# Patient Record
Sex: Male | Born: 1990 | Race: White | Hispanic: No | Marital: Single | Smoking: Never smoker
Health system: Southern US, Community
[De-identification: ages and names within clinical notes are randomized; demographics above are authoritative.]

## PROBLEM LIST (undated history)

## (undated) DIAGNOSIS — G4730 Sleep apnea, unspecified: Secondary | ICD-10-CM

## (undated) HISTORY — DX: Sleep apnea, unspecified: G47.30

---
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
## 2020-06-13 ENCOUNTER — Encounter (HOSPITAL_COMMUNITY): Payer: Self-pay | Admitting: Emergency Medicine

## 2020-06-13 ENCOUNTER — Ambulatory Visit (HOSPITAL_COMMUNITY)
Admission: EM | Admit: 2020-06-13 | Discharge: 2020-06-13 | Disposition: A | Discharge status: Home / Self Care | Payer: BC Managed Care – PPO

## 2020-06-13 ENCOUNTER — Other Ambulatory Visit: Payer: Self-pay

## 2020-06-13 DIAGNOSIS — R1031 Right lower quadrant pain: Secondary | ICD-10-CM

## 2020-06-13 NOTE — ED Triage Notes (Signed)
Pt here for RLQ pain onset 2 days that is intermittently  Denies f/v/n/d, constipation  Denies pain at the moment.   A&O x4... NAD.Marland Kitchen. ambulatory

## 2020-06-13 NOTE — ED Provider Notes (Signed)
[facility]    CSN: [id number] Arrival date & time: 06/13/20  1853      History   Chief Complaint Chief Complaint  Patient presents with  . Abdominal Pain    HPI Everest Brod is a 30 y.o. male.   Here today with 2 days of intermittent episodes of mild to moderate RLQ abdominal pain that he states feels like a balloon inside of him. Seems to be present when he wakes up in the morning but works it's way out once he's up and moving for the day. Comes back randomly and does not seem to have triggers or relieving factors. Denies N/V/D, constipation, fever, skin changes, new foods, sick contacts.      History reviewed. No pertinent past medical history.  There are no problems to display for this patient.   History reviewed. No pertinent surgical history.     Home Medications    Prior to Admission medications   Not on File    Family History History reviewed. No pertinent family history.  Social History Social History   Tobacco Use  . Smoking status: Never Smoker  . Smokeless tobacco: Never Used  Substance Use Topics  . Alcohol use: Yes     Allergies   Penicillins   Review of Systems Review of Systems PER HPI    Physical Exam Triage Vital Signs ED Triage Vitals  Enc Vitals Group     BP 06/13/20 1934 (!) 165/95     Pulse Rate 06/13/20 1934 98     Resp 06/13/20 1934 20     Temp 06/13/20 1934 98.7 F (37.1 C)     Temp Source 06/13/20 1934 Oral     SpO2 06/13/20 1934 100 %     Weight --      Height --      Head Circumference --      Peak Flow --      Pain Score 06/13/20 1933 0     Pain Loc --      Pain Edu? --      Excl. in GC? --    No data found.  Updated Vital Signs BP (!) 165/95 (BP Location: Right Arm)   Pulse 98   Temp 98.7 F (37.1 C) (Oral)   Resp 20   SpO2 100%   Visual Acuity Right Eye Distance:   Left Eye Distance:   Bilateral Distance:    Right Eye Near:   Left Eye Near:    Bilateral Near:     Physical  Exam Vitals and nursing note reviewed.  Constitutional:      Appearance: Normal appearance.  HENT:     Head: Atraumatic.     Nose: Nose normal.     Mouth/Throat:     Mouth: Mucous membranes are moist.     Pharynx: Oropharynx is clear. No posterior oropharyngeal erythema.  Eyes:     Extraocular Movements: Extraocular movements intact.     Conjunctiva/sclera: Conjunctivae normal.  Cardiovascular:     Rate and Rhythm: Normal rate and regular rhythm.  Pulmonary:     Effort: Pulmonary effort is normal.     Breath sounds: Normal breath sounds.  Abdominal:     General: Bowel sounds are normal. There is no distension.     Palpations: Abdomen is soft. There is no mass.     Tenderness: There is no abdominal tenderness. There is no right CVA tenderness, left CVA tenderness, guarding or rebound.     Hernia: No hernia  is present.  Musculoskeletal:        General: Normal range of motion.     Cervical back: Normal range of motion and neck supple.  Skin:    General: Skin is warm and dry.  Neurological:     General: No focal deficit present.     Mental Status: He is oriented to person, place, and time.  Psychiatric:        Mood and Affect: Mood normal.        Thought Content: Thought content normal.        Judgment: Judgment normal.      UC Treatments / Results  Labs (all labs ordered are listed, but only abnormal results are displayed) Labs Reviewed - No data to display  EKG   Radiology No results found.  Procedures Procedures (including critical care time)  Medications Ordered in UC Medications - No data to display  Initial Impression / Assessment and Plan / UC Course  I have reviewed the triage vital signs and the nursing notes.  Pertinent labs & imaging results that were available during my care of the patient were reviewed by me and considered in my medical decision making (see chart for details).     Pain not currently present, vitals and exam completely benign.  DIscussed OTC pain relievers, close monitoring for return/worsening sxs and outpatient f/u in the next week for recheck and scans if needed. ER precautions reviewed additionally.  Final Clinical Impressions(s) / UC Diagnoses   Final diagnoses:  Right lower quadrant abdominal pain   Discharge Instructions   None    ED Prescriptions    None     PDMP not reviewed this encounter.   Particia Nearing, [place] 06/17/20 1127

## 2020-08-22 ENCOUNTER — Other Ambulatory Visit: Payer: Self-pay | Admitting: Physician Assistant

## 2020-08-22 ENCOUNTER — Encounter: Payer: Self-pay | Admitting: Physician Assistant

## 2020-08-22 ENCOUNTER — Other Ambulatory Visit: Payer: Self-pay

## 2020-08-22 ENCOUNTER — Ambulatory Visit: Payer: BC Managed Care – PPO | Admitting: Physician Assistant

## 2020-08-22 VITALS — BP 150/100 | HR 85 | Temp 97.3°F | Ht 70.75 in | Wt 362.0 lb

## 2020-08-22 DIAGNOSIS — E785 Hyperlipidemia, unspecified: Secondary | ICD-10-CM | POA: Insufficient documentation

## 2020-08-22 DIAGNOSIS — E8881 Metabolic syndrome: Secondary | ICD-10-CM | POA: Insufficient documentation

## 2020-08-22 DIAGNOSIS — I10 Essential (primary) hypertension: Secondary | ICD-10-CM

## 2020-08-22 DIAGNOSIS — R7309 Other abnormal glucose: Secondary | ICD-10-CM

## 2020-08-22 DIAGNOSIS — E669 Obesity, unspecified: Secondary | ICD-10-CM | POA: Insufficient documentation

## 2020-08-22 HISTORY — DX: Hyperlipidemia, unspecified: E78.5

## 2020-08-22 HISTORY — DX: Essential (primary) hypertension: I10

## 2020-08-22 LAB — COMPREHENSIVE METABOLIC PANEL
ALT: 42 U/L (ref 0–53)
AST: 25 U/L (ref 0–37)
Albumin: 4.6 g/dL (ref 3.5–5.2)
Alkaline Phosphatase: 69 U/L (ref 39–117)
BUN: 10 mg/dL (ref 6–23)
CO2: 30 mEq/L (ref 19–32)
Calcium: 9.7 mg/dL (ref 8.4–10.5)
Chloride: 101 mEq/L (ref 96–112)
Creatinine, Ser: 0.77 mg/dL (ref 0.40–1.50)
GFR: 122.76 mL/min (ref >60.00)
Glucose, Bld: 126 mg/dL — ABNORMAL HIGH (ref 70–99)
Potassium: 4.5 mEq/L (ref 3.5–5.1)
Sodium: 138 mEq/L (ref 135–145)
Total Bilirubin: 0.7 mg/dL (ref 0.2–1.2)
Total Protein: 7.4 g/dL (ref 6.0–8.3)

## 2020-08-22 LAB — LIPID PANEL
Cholesterol: 216 mg/dL — ABNORMAL HIGH (ref 0–200)
HDL: 32.7 mg/dL — ABNORMAL LOW (ref >39.00)
LDL Cholesterol: 145 mg/dL — ABNORMAL HIGH (ref 0–99)
NonHDL: 183.73
Total CHOL/HDL Ratio: 7
Triglycerides: 193 mg/dL — ABNORMAL HIGH (ref 0.0–149.0)
VLDL: 38.6 mg/dL (ref 0.0–40.0)

## 2020-08-22 LAB — CBC WITH DIFFERENTIAL/PLATELET
Basophils Absolute: 0 10*3/uL (ref 0.0–0.1)
Basophils Relative: 0.3 % (ref 0.0–3.0)
Eosinophils Absolute: 0.2 10*3/uL (ref 0.0–0.7)
Eosinophils Relative: 2.5 % (ref 0.0–5.0)
HCT: 47.3 % (ref 39.0–52.0)
Hemoglobin: 16.2 g/dL (ref 13.0–17.0)
Lymphocytes Relative: 22.3 % (ref 12.0–46.0)
Lymphs Abs: 2.1 10*3/uL (ref 0.7–4.0)
MCHC: 34.2 g/dL (ref 30.0–36.0)
MCV: 89.8 fl (ref 78.0–100.0)
Monocytes Absolute: 0.7 10*3/uL (ref 0.1–1.0)
Monocytes Relative: 7 % (ref 3.0–12.0)
Neutro Abs: 6.5 10*3/uL (ref 1.4–7.7)
Neutrophils Relative %: 67.9 % (ref 43.0–77.0)
Platelets: 310 10*3/uL (ref 150.0–400.0)
RBC: 5.27 Mil/uL (ref 4.22–5.81)
RDW: 13.3 % (ref 11.5–15.5)
WBC: 9.5 10*3/uL (ref 4.0–10.5)

## 2020-08-22 LAB — HEMOGLOBIN A1C: Hgb A1c MFr Bld: 6.2 % (ref 4.6–6.5)

## 2020-08-22 MED ORDER — AMLODIPINE BESYLATE 5 MG PO TABS: 5.0000 mg | ORAL_TABLET | Freq: Every day | ORAL | 1 refills | Status: DC

## 2020-08-22 NOTE — Patient Instructions (Addendum)
It was great to see you!  Start Amlodopine (Norvasc) 5 mg daily.  Keep your blood pressure log for [place] -- take about 2-3 times per week.  Initial goal is less than 140/90 I'd be happiest if we get to 130/80  Let's follow-up in 4-6 weeks, sooner if you have concerns.  Take care,  Jarold Motto PA-C   How to Take Your Blood Pressure Blood pressure is a measurement of how strongly your blood is pressing against the walls of your arteries. Arteries are blood vessels that carry blood from your heart throughout your body. Your health care provider takes your blood pressure at each office visit. You can also take your own blood pressure at home with a blood pressure monitor. You may need to take your own blood pressure to:  Confirm a diagnosis of high blood pressure (hypertension).  Monitor your blood pressure over time.  Make sure your blood pressure medicine is working. Supplies needed:  Blood pressure monitor.  Dining room chair to sit in.  Table or desk.  Small notebook and pencil or pen. How to prepare To get the most accurate reading, avoid the following for 30 minutes before you check your blood pressure:  Drinking caffeine.  Drinking alcohol.  Eating.  Smoking.  Exercising. Five minutes before you check your blood pressure:  Use the bathroom and urinate so that you have an empty bladder.  Sit quietly in a dining room chair. Do not sit in a soft couch or an armchair. Do not talk. How to take your blood pressure To check your blood pressure, follow the instructions in the manual that came with your blood pressure monitor. If you have a digital blood pressure monitor, the instructions may be as follows: 1. Sit up straight in a chair. 2. Place your feet on the floor. Do not cross your ankles or legs. 3. Rest your left arm at the level of your heart on a table or desk or on the arm of a chair. 4. Pull up your shirt sleeve. 5. Wrap the blood pressure cuff around  the upper part of your left arm, 1 inch (2.5 cm) above your elbow. It is best to wrap the cuff around bare skin. 6. Fit the cuff snugly around your arm. You should be able to place only one finger between the cuff and your arm. 7. Position the cord so that it rests in the bend of your elbow. 8. Press the power button. 9. Sit quietly while the cuff inflates and deflates. 10. Read the digital reading on the monitor screen and write the numbers down (record them) in a notebook. 11. Wait 2-3 minutes, then repeat the steps, starting at step 1.   What does my blood pressure reading mean? A blood pressure reading consists of a higher number over a lower number. Ideally, your blood pressure should be below 120/80. The first ("top") number is called the systolic pressure. It is a measure of the pressure in your arteries as your heart beats. The second ("bottom") number is called the diastolic pressure. It is a measure of the pressure in your arteries as the heart relaxes. Blood pressure is classified into five stages. The following are the stages for adults who do not have a short-term serious illness or a chronic condition. Systolic pressure and diastolic pressure are measured in a unit called mm Hg (millimeters of mercury).  Normal  Systolic pressure: below 120.  Diastolic pressure: below 80. Elevated  Systolic pressure: 120-129.  Diastolic  pressure: below 80. Hypertension stage 1  Systolic pressure: 130-139.  Diastolic pressure: 80-89. Hypertension stage 2  Systolic pressure: 140 or above.  Diastolic pressure: 90 or above. You can have elevated blood pressure or hypertension even if only the systolic or only the diastolic number in your reading is higher than normal. Follow these instructions at home:  Check your blood pressure as often as recommended by your health care provider.  Check your blood pressure at the same time every day.  Take your monitor to the next appointment with  your health care provider to make sure that: ? You are using it correctly. ? It provides accurate readings.  Be sure you understand what your goal blood pressure numbers are.  Tell your health care provider if you are having any side effects from blood pressure medicine.  Keep all follow-up visits as told by your health care provider. This is important. General tips  Your health care provider can suggest a reliable monitor that will meet your needs. There are several types of home blood pressure monitors.  Choose a monitor that has an arm cuff. Do not choose a monitor that measures your blood pressure from your wrist or finger.  Choose a cuff that wraps snugly around your upper arm. You should be able to fit only one finger between your arm and the cuff.  You can buy a blood pressure monitor at most drugstores or online. Where to find more information American Heart Association: www.heart.org Contact a health care provider if:  Your blood pressure is consistently high. Get help right away if:  Your systolic blood pressure is higher than 180.  Your diastolic blood pressure is higher than 120. Summary  Blood pressure is a measurement of how strongly your blood is pressing against the walls of your arteries.  A blood pressure reading consists of a higher number over a lower number. Ideally, your blood pressure should be below 120/80.  Check your blood pressure at the same time every day.  Avoid caffeine, alcohol, smoking, and exercise for 30 minutes prior to checking your blood pressure. These agents can affect the accuracy of the blood pressure reading. This information is not intended to replace advice given to you by your health care provider. Make sure you discuss any questions you have with your health care provider. Document Revised: 05/14/2019 Document Reviewed: 05/14/2019 Elsevier Patient Education  2021 [employer].

## 2020-08-22 NOTE — Progress Notes (Signed)
Barry Walker is a 30 y.o. male is here to establish care and discuss blood pressure.  I acted as a Neurosurgeon for [employer], PA-C Barry Mull, LPN   History of Present Illness:   Chief Complaint  Patient presents with  . Establish Care  . c/o elevated blood pressure     HPI  Pt is here to establish care today.  Elevated blood pressure Pt c/o blood pressure elevated the past 6-8 months. Pt says when he has been at urgent care it has been elevated. Pt denies headaches, dizziness, blurred vision, chest pain, SOB or lower leg edema. Denies excessive caffeine intake, stimulant usage, excessive alcohol intake or increase in salt consumption.  Does not have blood pressure monitor at home. Does not drink soda. Drinks lots of water and occasional sugar-free lemonade.  Recently joined [employer] -- goes 4-5 times per week.  Played baseball from [age] years old. Was around 210 lbs at this time and then had a major stent of depression that caused him to stop taking care of himself causing weight gain and poor food choices. Does snore at night. Does not feel like he is waking up every few hours, does not gasp or choke.     Health Maintenance Due  Topic Date Due  . Hepatitis C Screening  Never done  . HIV Screening  Never done  . TETANUS/TDAP  Never done    Past Medical History:  Diagnosis Date  . Allergy February 10, 1993   Penicilin     Social History   Tobacco Use  . Smoking status: Never Smoker  . Smokeless tobacco: Never Used  . Tobacco comment: A cigar on special occasions, but no cigs/vape  Vaping Use  . Vaping Use: Never used  Substance Use Topics  . Alcohol use: Yes    Alcohol/week: 3.0 standard drinks    Types: 3 Standard drinks or equivalent per week    Comment: Only drink on Saturday during a normal week  . Drug use: Never    History reviewed. No pertinent surgical history.  Family History  Problem Relation Age of Onset  . ADD / ADHD Mother   . Anxiety  disorder Mother   . Depression Mother   . ADD / ADHD Sister   . Colon cancer Maternal Grandfather 46  . Hypertension Father   . Prostate cancer Neg Hx   . Breast cancer Neg Hx   . Stroke Neg Hx   . Heart attack Neg Hx     PMHx, SurgHx, SocialHx, FamHx, Medications, and Allergies were reviewed in the Visit Navigator and updated as appropriate.   There are no problems to display for this patient.   Social History   Tobacco Use  . Smoking status: Never Smoker  . Smokeless tobacco: Never Used  . Tobacco comment: A cigar on special occasions, but no cigs/vape  Vaping Use  . Vaping Use: Never used  Substance Use Topics  . Alcohol use: Yes    Alcohol/week: 3.0 standard drinks    Types: 3 Standard drinks or equivalent per week    Comment: Only drink on Saturday during a normal week  . Drug use: Never    Current Medications and Allergies:    Current Outpatient Medications:  .  amLODipine (NORVASC) 5 MG tablet, Take 1 tablet (5 mg total) by mouth daily., Disp: 30 tablet, Rfl: 1 .  Multiple Vitamin (MULTIVITAMIN) tablet, Take 1 tablet by mouth daily., Disp: , Rfl:  .  naproxen sodium (ALEVE) 220  MG tablet, Take 220 mg by mouth as needed., Disp: , Rfl:    Allergies  Allergen Reactions  . Penicillins     Reaction = unknown, was told as a child he had a reaction to it.     Review of Systems   ROS  Negative unless otherwise specified per HPI.  Vitals:   Vitals:   08/22/20 1118  BP: (!) 150/100  Pulse: 85  Temp: (!) 97.3 F (36.3 C)  TempSrc: Temporal  SpO2: 96%  Weight: (!) 362 lb (164.2 kg)  Height: 5' 10.75" (1.797 m)     Body mass index is 50.85 kg/m.   Physical Exam:    Physical Exam Vitals and nursing note reviewed.  Constitutional:      General: He is not in acute distress.    Appearance: He is well-developed. He is not ill-appearing or toxic-appearing.  Cardiovascular:     Rate and Rhythm: Normal rate and regular rhythm.     Pulses: Normal pulses.      Heart sounds: Normal heart sounds, S1 normal and S2 normal.     Comments: No LE edema Pulmonary:     Effort: Pulmonary effort is normal.     Breath sounds: Normal breath sounds.  Skin:    General: Skin is warm and dry.  Neurological:     Mental Status: He is alert.     GCS: GCS eye subscore is 4. GCS verbal subscore is 5. GCS motor subscore is 6.  Psychiatric:        Speech: Speech normal.        Behavior: Behavior normal. Behavior is cooperative.      Assessment and Plan:    Barry Walker was seen today for establish care and c/o elevated blood pressure .  Diagnoses and all orders for this visit:  Primary hypertension Above goal. New diagnosis for patient. Start norvasc 5 mg daily. Update blood work. Continue to work on weight loss and healthy lifestyle habits. BP log and parameters provided. Follow-up in 4-6 weeks, sooner if concerns. -     CBC with Differential/Platelet -     Comprehensive metabolic panel -     Lipid panel  Other orders -     amLODipine (NORVASC) 5 MG tablet; Take 1 tablet (5 mg total) by mouth daily.   CMA or LPN served as scribe during this visit. History, Physical, and Plan performed by medical provider. The above documentation has been reviewed and is accurate and complete.   Barry Motto, PA-C [place] 08/22/2020  Follow-up: No follow-ups on file.

## 2020-08-22 NOTE — Addendum Note (Signed)
Addended by: Vincenza Hews on: 08/22/2020 02:38 PM   Modules accepted: Orders

## 2020-09-26 ENCOUNTER — Ambulatory Visit: Payer: BC Managed Care – PPO | Admitting: Physician Assistant

## 2020-09-26 ENCOUNTER — Other Ambulatory Visit: Payer: Self-pay

## 2020-09-26 ENCOUNTER — Encounter: Payer: Self-pay | Admitting: Physician Assistant

## 2020-09-26 VITALS — BP 154/100 | HR 82 | Temp 97.8°F | Ht 70.75 in | Wt 356.5 lb

## 2020-09-26 DIAGNOSIS — I10 Essential (primary) hypertension: Secondary | ICD-10-CM | POA: Diagnosis not present

## 2020-09-26 MED ORDER — AMLODIPINE BESYLATE 10 MG PO TABS: 10.0000 mg | ORAL_TABLET | Freq: Every day | ORAL | 1 refills | Status: DC

## 2020-09-26 NOTE — Patient Instructions (Addendum)
It was great to see you!  Increase Norvasc to 10 mg  Premier protein bar (or something with similar nutrition breakdown)  Switch up your cereals, refer to handout  Please follow-up with Dr. Jimmey Ralph or Alyssa in 2-3 months to recheck your , sooner if you have concerns.  Take care,  Jarold Motto PA-C

## 2020-09-26 NOTE — Progress Notes (Signed)
Barry Walker is a 30 y.o. male is here for follow up.  I acted as a Neurosurgeon for [employer], PA-C Corky Mull, LPN   History of Present Illness:   Chief Complaint  Patient presents with  . Hypertension    HPI  Hypertension Pt here for f/u on blood pressure. Currently taking Amlodipine 5 mg daily, was started on this during our last visit. He has been checking blood pressure at home twice a week, systolic 149-167, diastolic 93-101.  Pt denies headaches, dizziness, blurred vision, chest pain, SOB or lower leg edema. Denies excessive caffeine intake, stimulant usage, excessive alcohol intake or increase in salt consumption.  He has lost another 6 lb since I last saw him. He is working out at [employer] regularly and eating healthy. Continues to feel motivated, buying new clothes.  Dietary recall: Pre-work out -- nature [place] bar Post-work out -- fat free greek yogurt with added scoop of oats, cheerios (x2 servings) with skim milk  Lunch: sugar free strawberry preserves, peanut butter, keto bread Dinner: buffalo grilled chicken, fat free shredded cheese, fresh veggies -- all in a spinach wrap Snack -- nature valley bar  No sodas/juices  Health Maintenance Due  Topic Date Due  . Hepatitis C Screening  Never done  . HIV Screening  Never done  . TETANUS/TDAP  Never done    Past Medical History:  Diagnosis Date  . Allergy 03-Oct-1992   Penicilin  . HLD (hyperlipidemia) 08/22/2020  . Hypertension 08/22/2020     Social History   Tobacco Use  . Smoking status: Never Smoker  . Smokeless tobacco: Never Used  . Tobacco comment: A cigar on special occasions, but no cigs/vape  Vaping Use  . Vaping Use: Never used  Substance Use Topics  . Alcohol use: Yes    Alcohol/week: 3.0 standard drinks    Types: 3 Standard drinks or equivalent per week    Comment: Only drink on Saturday during a normal week  . Drug use: Never    History reviewed. No pertinent surgical  history.  Family History  Problem Relation Age of Onset  . ADD / ADHD Mother   . Anxiety disorder Mother   . Depression Mother   . ADD / ADHD Sister   . Colon cancer Maternal Grandfather 30  . Hypertension Father   . Prostate cancer Neg Hx   . Breast cancer Neg Hx   . Stroke Neg Hx   . Heart attack Neg Hx     PMHx, SurgHx, SocialHx, FamHx, Medications, and Allergies were reviewed in the Visit Navigator and updated as appropriate.   Patient Active Problem List   Diagnosis Date Noted  . Obesity 08/22/2020  . Hypertension 08/22/2020  . Insulin resistance 08/22/2020  . HLD (hyperlipidemia) 08/22/2020    Social History   Tobacco Use  . Smoking status: Never Smoker  . Smokeless tobacco: Never Used  . Tobacco comment: A cigar on special occasions, but no cigs/vape  Vaping Use  . Vaping Use: Never used  Substance Use Topics  . Alcohol use: Yes    Alcohol/week: 3.0 standard drinks    Types: 3 Standard drinks or equivalent per week    Comment: Only drink on Saturday during a normal week  . Drug use: Never    Current Medications and Allergies:    Current Outpatient Medications:  .  amLODipine (NORVASC) 10 MG tablet, Take 1 tablet (10 mg total) by mouth daily., Disp: 90 tablet, Rfl: 1 .  Multiple  Vitamin (MULTIVITAMIN) tablet, Take 1 tablet by mouth daily., Disp: , Rfl:  .  naproxen sodium (ALEVE) 220 MG tablet, Take 220 mg by mouth as needed., Disp: , Rfl:    Allergies  Allergen Reactions  . Penicillins     Reaction = unknown, was told as a child he had a reaction to it.     Review of Systems   ROS  Negative unless otherwise specified per HPI.  Vitals:   Vitals:   09/26/20 1103  BP: (!) 154/100  Pulse: 82  Temp: 97.8 F (36.6 C)  TempSrc: Temporal  SpO2: 97%  Weight: (!) 356 lb 8 oz (161.7 kg)  Height: 5' 10.75" (1.797 m)     Body mass index is 50.07 kg/m.   Physical Exam:    Physical Exam Vitals and nursing note reviewed.  Constitutional:       General: He is not in acute distress.    Appearance: He is well-developed. He is not ill-appearing or toxic-appearing.  Cardiovascular:     Rate and Rhythm: Normal rate and regular rhythm.     Pulses: Normal pulses.     Heart sounds: Normal heart sounds, S1 normal and S2 normal.     Comments: No LE edema Pulmonary:     Effort: Pulmonary effort is normal.     Breath sounds: Normal breath sounds.  Skin:    General: Skin is warm and dry.  Neurological:     Mental Status: He is alert.     GCS: GCS eye subscore is 4. GCS verbal subscore is 5. GCS motor subscore is 6.  Psychiatric:        Speech: Speech normal.        Behavior: Behavior normal. Behavior is cooperative.      Assessment and Plan:    Barry Walker was seen today for hypertension.  Diagnoses and all orders for this visit:  Primary hypertension  Other orders -     amLODipine (NORVASC) 10 MG tablet; Take 1 tablet (10 mg total) by mouth daily.   Remains uncontrolled. Increase amlodopine to 10 mg daily. Follow-up in office in 2-3 months with Parker/Alyssa, sooner if concerns. Reviewed his dietary regimen today. Recommended: 1. replacing oats with nuts for additional protein at breakfast 2. choosing better nutritionally balanced bar, such as Premier Protein, or other high protein bar 3. Provided handout on healthy cereal options, work on only 1 serving  CMA or LPN served as Neurosurgeon during this visit. History, Physical, and Plan performed by medical provider. The above documentation has been reviewed and is accurate and complete.  Jarold Motto, PA-C [place] 09/26/2020  Follow-up: No follow-ups on file.

## 2020-10-16 ENCOUNTER — Other Ambulatory Visit: Payer: Self-pay | Admitting: Physician Assistant

## 2020-12-15 ENCOUNTER — Encounter: Payer: Self-pay | Admitting: Family Medicine

## 2020-12-15 ENCOUNTER — Ambulatory Visit: Payer: BC Managed Care – PPO | Admitting: Family Medicine

## 2020-12-15 ENCOUNTER — Other Ambulatory Visit: Payer: Self-pay

## 2020-12-15 VITALS — BP 137/85 | HR 98 | Temp 98.0°F | Ht 70.75 in | Wt 351.8 lb

## 2020-12-15 DIAGNOSIS — K219 Gastro-esophageal reflux disease without esophagitis: Secondary | ICD-10-CM | POA: Insufficient documentation

## 2020-12-15 DIAGNOSIS — M6208 Separation of muscle (nontraumatic), other site: Secondary | ICD-10-CM

## 2020-12-15 DIAGNOSIS — E669 Obesity, unspecified: Secondary | ICD-10-CM | POA: Diagnosis not present

## 2020-12-15 DIAGNOSIS — I10 Essential (primary) hypertension: Secondary | ICD-10-CM

## 2020-12-15 HISTORY — DX: Separation of muscle (nontraumatic), other site: M62.08

## 2020-12-15 HISTORY — DX: Gastro-esophageal reflux disease without esophagitis: K21.9

## 2020-12-15 NOTE — Progress Notes (Signed)
Patient  Shoji Pertuit is a 30 y.o. male who presents today for an office visit.  Assessment/Plan:  Chronic Problems Addressed Today: Rectus diastasis Discussed diagnosis with patient.  Discussed conservative management including weight loss and core strengthening exercises.  GERD (gastroesophageal reflux disease) Stable on over-the-counter Tums as needed.  Hypertension Recently had amlodipine changed to 10 mg daily.  We will continue current dose.  He will follow-up with his PCP in about 6 months.  Continue home monitoring.  Obesity Discussed lifestyle modifications.  He is working on diet and exercise and is going to the gym regularly.     Subjective:  HPI:  With concern for abdominal hernia.  Noticed few days ago.  Noticed a bulge above his bellybutton.  Has also had some reflux.  He comes with his wife.  No constipation.  No diarrhea.  No abdominal pain.  No recent injuries or precipitating events.       Objective:  Physical Exam: BP 137/85   Pulse 98   Temp 98 F (36.7 C) (Temporal)   Ht 5' 10.75" (1.797 m)   Wt (!) 351 lb 12.8 oz (159.6 kg)   SpO2 98%   BMI 49.41 kg/m   Gen: No acute distress, resting comfortably GI: Rectus diastases noted.  No umbilical hernia. Neuro: Grossly normal, moves all extremities Psych: Normal affect and thought content      Amandeep Nesmith M. Jimmey Ralph, MD 12/15/2020 3:05 PM

## 2020-12-15 NOTE — Assessment & Plan Note (Signed)
Recently had amlodipine changed to 10 mg daily.  We will continue current dose.  He will follow-up with his PCP in about 6 months.  Continue home monitoring.

## 2020-12-15 NOTE — Assessment & Plan Note (Signed)
Discussed lifestyle modifications.  He is working on diet and exercise and is going to the gym regularly.

## 2020-12-15 NOTE — Assessment & Plan Note (Signed)
Stable on over-the-counter Tums as needed.

## 2020-12-15 NOTE — Assessment & Plan Note (Signed)
Discussed diagnosis with patient.  Discussed conservative management including weight loss and core strengthening exercises.

## 2020-12-15 NOTE — Patient Instructions (Addendum)
It was very nice to see you today!  You have rectus diastases.  Please continue to work on exercise and weight loss.  Your blood pressure looks good today.  Please continue your current blood pressure medication.  We will see you back in 6 months.  Please come back to see [place] sooner if needed.  Take care, Dr Jimmey Ralph  PLEASE NOTE:  If you had any lab tests please let us know if you have not heard back within a few days. You may see your results on mychart before we have a chance to review them but we will give you a call once they are reviewed by [place]. If we ordered any referrals today, please let us know if you have not heard from their office within the next week.   Please try these tips to maintain a healthy lifestyle:  Eat at least 3 REAL meals and 1-2 snacks per day.  Aim for no more than 5 hours between eating.  If you eat breakfast, please do so within one hour of getting up.   Each meal should contain half fruits/vegetables, one quarter protein, and one quarter carbs (no bigger than a computer mouse)  Cut down on sweet beverages. This includes juice, soda, and sweet tea.   Drink at least 1 glass of water with each meal and aim for at least 8 glasses per day  Exercise at least 150 minutes every week.    Diastasis Recti  Diastasis recti is a condition in which the muscles of the abdomen (rectus abdominis muscles) become thin and separate. The result is a wider space between the muscles of the right and left abdomen (abdominal muscles). This wider space between the muscles may cause a bulge in the middle of the abdomen. This bulge may be noticed when a person is straining or when he or shesits up after lying down. Diastasis recti can affect men and women. It is most common among pregnant women, babies, people with obesity, and people who have had abdominal surgery.Exercise or surgery may help correct this condition. What are the causes? Common causes of this condition  include: Pregnancy. As the uterus grows in size, it puts pressure on the abdominal muscles, causing the muscles to separate. Obesity. Excess fat puts pressure on abdominal muscles. Weight lifting. Some exercises of the abdomen. Advanced age. Genetics. Having had surgery on the abdomen before. What increases the risk? This condition is more likely to develop in: Women. Newborns, especially newborns who are born early (prematurely). What are the signs or symptoms? Common symptoms of this condition include: A bulge in the middle of your abdomen. You will notice it most when you sit up or strain. Pain in your low back, hips, or the area between your hip bones (pelvis). Constipation. Being unable to control when you urinate (urinary incontinence). Bloating. Poor posture. How is this diagnosed? This condition is diagnosed with a physical exam. During the exam, your health care provider will ask you to lie flat on your back and do a crunch or half sit-up. If you have diastasis recti, a bulge will appear lengthwise between your abdominal muscles in the center of your abdomen. Your health care provider will measure the gap between your muscles with one of the following: A medical device used to measure the space between two objects (caliper). A tape measure. CT scan. Ultrasound. Finger spaces. Your health care provider will measure the space using his or her fingers. How is this treated? If your muscle  separation is not too large, you may not need treatment. However, if you are a woman who plans to become pregnant again, you should treat this condition before your next pregnancy. Treatment may include: Physical therapy exercises to strengthen and tighten your abdominal muscles. Lifestyle changes such as weight loss and exercise. Over-the-counter pain medicines as needed. Surgery to correct the separation. Follow these instructions at home: Activity Return to your normal activities as told  by your health care provider. Ask your health care provider what activities are safe for you. Do exercises as told by your health care provider. Make sure you are doing your exercises and movements correctly when lifting weights or doing exercises using your abdominal muscles or the muscles in the center of your body that give stability (core muscles). Proper form can help to prevent this condition from happening again. General instructions If you are overweight, ask your health care provider for help with weight loss. Losing even a small amount of weight can help to improve your diastasis recti. Take over-the-counter or prescription medicines only as told by your health care provider. Do not strain. Straining can make the separation worse. Examples of straining include: Pushing hard to have a bowel movement, such as when you have constipation. Lifting heavy objects or lifting children. Standing up and sitting down. You may need to take these actions to prevent or treat constipation: Drink enough fluid to keep your urine pale yellow. Take over-the-counter or prescription medicines. Eat foods that are high in fiber, such as beans, whole grains, and fresh fruits and vegetables. Limit foods that are high in fat and processed sugars, such as fried or sweet foods. Keep all follow-up visits. This is important. Contact a health care provider if: You notice a new bulge in your abdomen. Get help right away if: You experience severe discomfort in your abdomen. You develop severe abdominal pain along with nausea, vomiting, or a fever. Summary Diastasis recti is a condition in which the muscles of the abdomen (rectus abdominismuscles) become thin and separate. You may notice a bulge in your abdomen because the space has widened between the muscles of the right and left abdomen. The most common symptom is a bulge in the middle of your abdomen. You will notice it most when you sit up or strain. This  condition is diagnosed with a physical exam. If the muscle separation is not too big, you may not need treatment. Otherwise, you may need to do physical therapy or have surgery. This information is not intended to replace advice given to you by your health care provider. Make sure you discuss any questions you have with your healthcare provider. Document Revised: 01/21/2020 Document Reviewed: 01/21/2020 Elsevier Patient Education  2022 [employer].

## 2020-12-26 ENCOUNTER — Ambulatory Visit: Payer: BC Managed Care – PPO | Admitting: Family Medicine

## 2021-02-11 ENCOUNTER — Encounter (HOSPITAL_BASED_OUTPATIENT_CLINIC_OR_DEPARTMENT_OTHER): Payer: Self-pay | Admitting: *Deleted

## 2021-02-11 ENCOUNTER — Emergency Department (HOSPITAL_BASED_OUTPATIENT_CLINIC_OR_DEPARTMENT_OTHER)
Admission: EM | Admit: 2021-02-11 | Discharge: 2021-02-11 | Disposition: A | Discharge status: Home / Self Care | Payer: BC Managed Care – PPO | Attending: Emergency Medicine | Admitting: Emergency Medicine

## 2021-02-11 ENCOUNTER — Other Ambulatory Visit: Payer: Self-pay

## 2021-02-11 DIAGNOSIS — Z79899 Other long term (current) drug therapy: Secondary | ICD-10-CM | POA: Insufficient documentation

## 2021-02-11 DIAGNOSIS — R350 Frequency of micturition: Secondary | ICD-10-CM | POA: Insufficient documentation

## 2021-02-11 DIAGNOSIS — I10 Essential (primary) hypertension: Secondary | ICD-10-CM | POA: Diagnosis not present

## 2021-02-11 DIAGNOSIS — R109 Unspecified abdominal pain: Secondary | ICD-10-CM | POA: Insufficient documentation

## 2021-02-11 DIAGNOSIS — M545 Low back pain, unspecified: Secondary | ICD-10-CM | POA: Diagnosis not present

## 2021-02-11 DIAGNOSIS — R110 Nausea: Secondary | ICD-10-CM | POA: Diagnosis not present

## 2021-02-11 LAB — URINALYSIS, ROUTINE W REFLEX MICROSCOPIC
Bilirubin Urine: NEGATIVE
Glucose, UA: NEGATIVE mg/dL
Hgb urine dipstick: NEGATIVE
Leukocytes,Ua: NEGATIVE
Nitrite: NEGATIVE
Protein, ur: NEGATIVE mg/dL
Specific Gravity, Urine: 1.017 (ref 1.005–1.030)
pH: 6.5 (ref 5.0–8.0)

## 2021-02-11 LAB — CBC WITH DIFFERENTIAL/PLATELET
Abs Immature Granulocytes: 0.02 10*3/uL (ref 0.00–0.07)
Basophils Absolute: 0 10*3/uL (ref 0.0–0.1)
Basophils Relative: 0 %
Eosinophils Absolute: 0.1 10*3/uL (ref 0.0–0.5)
Eosinophils Relative: 1 %
HCT: 49.9 % (ref 39.0–52.0)
Hemoglobin: 17 g/dL (ref 13.0–17.0)
Immature Granulocytes: 0 %
Lymphocytes Relative: 15 %
Lymphs Abs: 1.6 10*3/uL (ref 0.7–4.0)
MCH: 29.8 pg (ref 26.0–34.0)
MCHC: 34.1 g/dL (ref 30.0–36.0)
MCV: 87.5 fL (ref 80.0–100.0)
Monocytes Absolute: 0.5 10*3/uL (ref 0.1–1.0)
Monocytes Relative: 5 %
Neutro Abs: 8.3 10*3/uL — ABNORMAL HIGH (ref 1.7–7.7)
Neutrophils Relative %: 79 %
Platelets: 330 10*3/uL (ref 150–400)
RBC: 5.7 MIL/uL (ref 4.22–5.81)
RDW: 12.8 % (ref 11.5–15.5)
WBC: 10.5 10*3/uL (ref 4.0–10.5)
nRBC: 0 % (ref 0.0–0.2)

## 2021-02-11 LAB — BASIC METABOLIC PANEL
Anion gap: 12 (ref 5–15)
BUN: 11 mg/dL (ref 6–20)
CO2: 24 mmol/L (ref 22–32)
Calcium: 10.4 mg/dL — ABNORMAL HIGH (ref 8.9–10.3)
Chloride: 101 mmol/L (ref 98–111)
Creatinine, Ser: 0.92 mg/dL (ref 0.61–1.24)
GFR, Estimated: >60 mL/min (ref >60)
Glucose, Bld: 128 mg/dL — ABNORMAL HIGH (ref 70–99)
Potassium: 3.8 mmol/L (ref 3.5–5.1)
Sodium: 137 mmol/L (ref 135–145)

## 2021-02-11 NOTE — Discharge Instructions (Addendum)
Your workup was very reassuring in the ED today. Your pain may be related to muscle soreness. I would recommend Ibuprofen as needed for pain.   Follow up with your PCP regarding ED visit and for further evaluation  Return to the ED IMMEDIATELY for any new/worsening symptoms

## 2021-02-11 NOTE — ED Provider Notes (Signed)
[facility] EMERGENCY DEPT Provider Note   CSN: [id number] Arrival date & time: 02/11/21  1317     History Chief Complaint  Patient presents with   Flank Pain    Barry Walker is a 30 y.o. male who presents to the ED today with complaint of gradual onset, intermittent, achy, left flank pain that began about 4 days ago. Pt also complains of urinary frequency and slight nausea this AM. He has not taken anything for pain and just dealt with it. He does not have any pain currently. He states the pain is brought on by certain movements. He decided to come get it checked out today after feeling nauseated.  She has never experienced the symptoms in the past.  He has no history of kidney stones.  Denies any fevers, chills, vomiting, abdominal pain, testicular pain. Denies any recent heavy lifting.   The history is provided by the patient and medical records.      Past Medical History:  Diagnosis Date   Allergy 11-06[birth year]92   Penicilin   GERD (gastroesophageal reflux disease) 12/15/2020   HLD (hyperlipidemia) 08/22/2020   Hypertension 08/22/2020   Rectus diastasis 12/15/2020    Patient Active Problem List   Diagnosis Date Noted   GERD (gastroesophageal reflux disease) 12/15/2020   Rectus diastasis 12/15/2020   Obesity 08/22/2020   Hypertension 08/22/2020   Insulin resistance 08/22/2020   HLD (hyperlipidemia) 08/22/2020    History reviewed. No pertinent surgical history.     Family History  Problem Relation Age of Onset   ADD / ADHD Mother    Anxiety disorder Mother    Depression Mother    ADD / ADHD Sister    Colon cancer Maternal Grandfather 62   Hypertension Father    Prostate cancer Neg Hx    Breast cancer Neg Hx    Stroke Neg Hx    Heart attack Neg Hx     Social History   Tobacco Use   Smoking status: Never   Smokeless tobacco: Never   Tobacco comments:    A cigar on special occasions, but no cigs/vape  Vaping Use   Vaping Use: Never used   Substance Use Topics   Alcohol use: Yes    Alcohol/week: 3.0 standard drinks    Types: 3 Standard drinks or equivalent per week    Comment: Only drink on Saturday during a normal week   Drug use: Never    Home Medications Prior to Admission medications   Medication Sig Start Date End Date Taking? Authorizing Provider  amLODipine (NORVASC) 10 MG tablet Take 1 tablet (10 mg total) by mouth daily. 09/26/20  Yes Jarold Motto, PA  Multiple Vitamin (MULTIVITAMIN) tablet Take 1 tablet by mouth daily.   Yes [provider]  naproxen sodium (ALEVE) 220 MG tablet Take 220 mg by mouth as needed.    [provider]    Allergies    Penicillins  Review of Systems   Review of Systems  Constitutional:  Negative for chills and fever.  Gastrointestinal:  Positive for nausea. Negative for abdominal pain, diarrhea and vomiting.  Genitourinary:  Positive for flank pain and frequency. Negative for dysuria and hematuria.  All other systems reviewed and are negative.  Physical Exam Updated Vital Signs BP 133/85   Pulse 96   Temp 98.5 F (36.9 C) (Oral)   Resp 20   Ht 5\' 10"  (1.778 m)   Wt (!) 154.2 kg   SpO2 98%   BMI 48.78 kg/m  Physical Exam Vitals and nursing note reviewed.  Constitutional:      Appearance: He is obese. He is not ill-appearing or diaphoretic.  HENT:     Head: Normocephalic and atraumatic.  Eyes:     Conjunctiva/sclera: Conjunctivae normal.  Cardiovascular:     Rate and Rhythm: Normal rate and regular rhythm.  Pulmonary:     Effort: Pulmonary effort is normal.     Breath sounds: Normal breath sounds. No wheezing, rhonchi or rales.  Abdominal:     Palpations: Abdomen is soft.     Tenderness: There is no abdominal tenderness. There is no right CVA tenderness, left CVA tenderness, guarding or rebound.     Comments: No obvious CVA TTP at this time however pt points to left CVA when describing pain  Musculoskeletal:     Cervical back: Neck  supple.  Skin:    General: Skin is warm and dry.  Neurological:     Mental Status: He is alert.    ED Results / Procedures / Treatments   Labs (all labs ordered are listed, but only abnormal results are displayed) Labs Reviewed  URINALYSIS, ROUTINE W REFLEX MICROSCOPIC - Abnormal; Notable for the following components:      Result Value   Ketones, ur TRACE (*)    All other components within normal limits  BASIC METABOLIC PANEL - Abnormal; Notable for the following components:   Glucose, Bld 128 (*)    Calcium 10.4 (*)    All other components within normal limits  CBC WITH DIFFERENTIAL/PLATELET - Abnormal; Notable for the following components:   Neutro Abs 8.3 (*)    All other components within normal limits    EKG None  Radiology No results found.  Procedures Procedures   Medications Ordered in ED Medications - No data to display  ED Course  I have reviewed the triage vital signs and the nursing notes.  Pertinent labs & imaging results that were available during my care of the patient were reviewed by me and considered in my medical decision making (see chart for details).    MDM Rules/Calculators/A&P                           30 year old male who presents to the ED today with complaint of left flank pain, nausea, urinary frequency for the past 4 days without history of same.  On arrival to the ED patient is afebrile, mildly tachycardic at 107 however this is decreased while he is in the room.  I suspect he was likely tachycardic when he first walked into the ED today due to strenuous activity.  Remainder vitals are unremarkable.  On my exam he has no obvious CVA TTP however points to his left flank when describing the pain. He has no abdominal tenderness palpation.  No rebound or guarding.  We will plan for urinalysis at this time to assess for any microscopic hemoglobin to suggest kidney stone.  We will also plan for lab work.  May consider CT scan if urinalysis does  show hemoglobin however question MSK pain at this time?  CBC without leukocytosis. Hgb stable at 17.0 BMP with glucose 128. No other electrolyte abnormalities. Calcium slightly elevated at 10.4 however only 0.1 above the upper limit of normal.  U/A with trace ketones; no other findings including hgb or infection.   Workup reassuring at this time. Suspect MSK type pain given pain brought on by certain movements of his back and  pt very comfortable appaering without obvious distress typically shown with stones. Have recommended Ibuprofen as needed for pain and PCP follow up. Pt in agreement with plan and stable for discharge.   This note was prepared using Dragon voice recognition software and may include unintentional dictation errors due to the inherent limitations of voice recognition software.   Final Clinical Impression(s) / ED Diagnoses Final diagnoses:  Acute left-sided low back pain without sciatica    Rx / DC Orders ED Discharge Orders     None        Discharge Instructions      Your workup was very reassuring in the ED today. Your pain may be related to muscle soreness. I would recommend Ibuprofen as needed for pain.   Follow up with your PCP regarding ED visit and for further evaluation  Return to the ED IMMEDIATELY for any new/worsening symptoms       Tanda Rockers, PA-C 02/11/21 1442    Gloris Manchester, MD 02/12/21 [phone]

## 2021-02-11 NOTE — ED Triage Notes (Signed)
Left flank/back pain with frequent urination and occ nausea.

## 2021-03-24 ENCOUNTER — Other Ambulatory Visit: Payer: Self-pay | Admitting: Physician Assistant

## 2021-06-18 ENCOUNTER — Ambulatory Visit: Payer: BC Managed Care – PPO | Admitting: Physician Assistant

## 2021-06-18 ENCOUNTER — Other Ambulatory Visit: Payer: Self-pay

## 2021-06-18 ENCOUNTER — Encounter: Payer: Self-pay | Admitting: Physician Assistant

## 2021-06-18 VITALS — BP 170/100 | HR 109 | Temp 98.0°F | Ht 70.0 in | Wt 351.0 lb

## 2021-06-18 DIAGNOSIS — E669 Obesity, unspecified: Secondary | ICD-10-CM

## 2021-06-18 DIAGNOSIS — I10 Essential (primary) hypertension: Secondary | ICD-10-CM | POA: Diagnosis not present

## 2021-06-18 DIAGNOSIS — E8881 Metabolic syndrome: Secondary | ICD-10-CM

## 2021-06-18 DIAGNOSIS — R29818 Other symptoms and signs involving the nervous system: Secondary | ICD-10-CM

## 2021-06-18 DIAGNOSIS — E785 Hyperlipidemia, unspecified: Secondary | ICD-10-CM

## 2021-06-18 DIAGNOSIS — Z1159 Encounter for screening for other viral diseases: Secondary | ICD-10-CM

## 2021-06-18 DIAGNOSIS — Z114 Encounter for screening for human immunodeficiency virus [HIV]: Secondary | ICD-10-CM

## 2021-06-18 LAB — CBC WITH DIFFERENTIAL/PLATELET
Basophils Absolute: 0 10*3/uL (ref 0.0–0.1)
Basophils Relative: 0.4 % (ref 0.0–3.0)
Eosinophils Absolute: 0.1 10*3/uL (ref 0.0–0.7)
Eosinophils Relative: 1.5 % (ref 0.0–5.0)
HCT: 48.2 % (ref 39.0–52.0)
Hemoglobin: 16.3 g/dL (ref 13.0–17.0)
Lymphocytes Relative: 18.9 % (ref 12.0–46.0)
Lymphs Abs: 1.7 10*3/uL (ref 0.7–4.0)
MCHC: 33.7 g/dL (ref 30.0–36.0)
MCV: 88.5 fl (ref 78.0–100.0)
Monocytes Absolute: 0.7 10*3/uL (ref 0.1–1.0)
Monocytes Relative: 7.8 % (ref 3.0–12.0)
Neutro Abs: 6.4 10*3/uL (ref 1.4–7.7)
Neutrophils Relative %: 71.4 % (ref 43.0–77.0)
Platelets: 312 10*3/uL (ref 150.0–400.0)
RBC: 5.44 Mil/uL (ref 4.22–5.81)
RDW: 13.5 % (ref 11.5–15.5)
WBC: 9 10*3/uL (ref 4.0–10.5)

## 2021-06-18 LAB — COMPREHENSIVE METABOLIC PANEL
ALT: 23 U/L (ref 0–53)
AST: 20 U/L (ref 0–37)
Albumin: 4.5 g/dL (ref 3.5–5.2)
Alkaline Phosphatase: 86 U/L (ref 39–117)
BUN: 11 mg/dL (ref 6–23)
CO2: 25 mEq/L (ref 19–32)
Calcium: 9.7 mg/dL (ref 8.4–10.5)
Chloride: 101 mEq/L (ref 96–112)
Creatinine, Ser: 0.72 mg/dL (ref 0.40–1.50)
GFR: 124.55 mL/min (ref >60.00)
Glucose, Bld: 144 mg/dL — ABNORMAL HIGH (ref 70–99)
Potassium: 3.4 mEq/L — ABNORMAL LOW (ref 3.5–5.1)
Sodium: 138 mEq/L (ref 135–145)
Total Bilirubin: 0.9 mg/dL (ref 0.2–1.2)
Total Protein: 8.2 g/dL (ref 6.0–8.3)

## 2021-06-18 LAB — HEPATITIS C ANTIBODY
Hepatitis C Ab: NONREACTIVE
SIGNAL TO CUT-OFF: 0.11 (ref <1.00)

## 2021-06-18 LAB — LIPID PANEL
Cholesterol: 236 mg/dL — ABNORMAL HIGH (ref 0–200)
HDL: 38.4 mg/dL — ABNORMAL LOW (ref >39.00)
NonHDL: 197.52
Total CHOL/HDL Ratio: 6
Triglycerides: 246 mg/dL — ABNORMAL HIGH (ref 0.0–149.0)
VLDL: 49.2 mg/dL — ABNORMAL HIGH (ref 0.0–40.0)

## 2021-06-18 LAB — TSH: TSH: 2.34 u[IU]/mL (ref 0.35–5.50)

## 2021-06-18 LAB — HEMOGLOBIN A1C: Hgb A1c MFr Bld: 6.1 % (ref 4.6–6.5)

## 2021-06-18 LAB — LDL CHOLESTEROL, DIRECT: Direct LDL: 183 mg/dL

## 2021-06-18 LAB — HIV ANTIBODY (ROUTINE TESTING W REFLEX): HIV 1&2 Ab, 4th Generation: NONREACTIVE

## 2021-06-18 MED ORDER — AMLODIPINE BESYLATE 10 MG PO TABS: 10.0000 mg | ORAL_TABLET | Freq: Every day | ORAL | 1 refills | Status: DC

## 2021-06-18 MED ORDER — LOSARTAN POTASSIUM-HCTZ 50-12.5 MG PO TABS: 1.0000 | ORAL_TABLET | Freq: Every day | ORAL | 1 refills | Status: DC

## 2021-06-18 NOTE — Progress Notes (Signed)
Barry Walker is a 31 y.o. male here for a follow up of HTN.  History of Present Illness:   Chief Complaint  Patient presents with   Hypertension    Pt last checked blood pressure last Thursday 140/97.     HPI  HTN Currently compliant with taking amlodipine 10 mg daily with no adverse effects. According to [facility], following our previous visit on 08/22/20, he had become really good about exercising and eating well. Reports that at one point he was walking 2-3 miles about 3 times a week. Unfortunately, he sustained a sprained ankle this past October which led to him not being able to walk normally for 6-8 weeks. He does believe that this has held him back from his regular exercise routine and could be the cause of his BP becoming elevated once more. Pt has been checking blood pressure at home, stating previous BP reading this past Thursday was 140/97. Patient denies chest pain, SOB, blurred vision, dizziness, unusual headaches, lower leg swelling.  Denies excessive caffeine intake, stimulant usage, excessive alcohol intake, or increase in salt consumption.  BP Readings from Last 3 Encounters:  06/18/21 (!) 170/100  02/11/21 133/85  12/15/20 137/85    Suspected sleep apnea Barry Walker states that during a trip, he had roomed with some friends who in turn told him that they believe he has sleep apnea that following morning. He has not noticed any concerning sx, other than snoring. Currently he is interested in undergoing testing for further evaluation.   HLD Pt has not been on medication for this issue, instead choosing to make healthier lifestyle choices. He is managing well and would like to have labs updated.   Insulin Resistance Barry Walker has not been on medication for this issue. Instead he has chose to make daily lifestyle changes for as long as he can before starting medication. Denies any ongoing sx. He is managing well.   Past Medical History:  Diagnosis Date   Allergy 1991-02-16    Penicilin   GERD (gastroesophageal reflux disease) 12/15/2020   HLD (hyperlipidemia) 08/22/2020   Hypertension 08/22/2020   Rectus diastasis 12/15/2020     Social History   Tobacco Use   Smoking status: Never   Smokeless tobacco: Never   Tobacco comments:    A cigar on special occasions, but no cigs/vape  Vaping Use   Vaping Use: Never used  Substance Use Topics   Alcohol use: Yes    Alcohol/week: 3.0 standard drinks    Types: 3 Standard drinks or equivalent per week    Comment: Only drink on Saturday during a normal week   Drug use: Never    History reviewed. No pertinent surgical history.  Family History  Problem Relation Age of Onset   ADD / ADHD Mother    Anxiety disorder Mother    Depression Mother    ADD / ADHD Sister    Colon cancer Maternal Grandfather 47   Hypertension Father    Prostate cancer Neg Hx    Breast cancer Neg Hx    Stroke Neg Hx    Heart attack Neg Hx     Allergies  Allergen Reactions   Penicillins     Reaction = unknown, was told as a child he had a reaction to it.     Current Medications:   Current Outpatient Medications:    amLODipine (NORVASC) 10 MG tablet, TAKE 1 TABLET BY MOUTH EVERY DAY, Disp: 90 tablet, Rfl: 1   Multiple Vitamin (MULTIVITAMIN) tablet, Take 1  tablet by mouth daily., Disp: , Rfl:    naproxen sodium (ALEVE) 220 MG tablet, Take 220 mg by mouth as needed., Disp: , Rfl:    Review of Systems:   ROS Negative unless otherwise specified per HPI. Vitals:   Vitals:   06/18/21 1030  BP: (!) 170/100  Pulse: (!) 109  Temp: 98 F (36.7 C)  TempSrc: Temporal  SpO2: 97%  Weight: (!) 351 lb (159.2 kg)  Height: _0  (1.778 m)     Body mass index is 50.36 kg/m.  HR rechecked: 89-110  Physical Exam:   Physical Exam Vitals and nursing note reviewed.  Constitutional:      General: He is not in acute distress.    Appearance: He is well-developed. He is not ill-appearing or toxic-appearing.  Cardiovascular:     Rate  and Rhythm: Normal rate and regular rhythm.     Pulses: Normal pulses.     Heart sounds: Normal heart sounds, S1 normal and S2 normal.  Pulmonary:     Effort: Pulmonary effort is normal.     Breath sounds: Normal breath sounds.  Skin:    General: Skin is warm and dry.  Neurological:     Mental Status: He is alert.     GCS: GCS eye subscore is 4. GCS verbal subscore is 5. GCS motor subscore is 6.  Psychiatric:        Speech: Speech normal.        Behavior: Behavior normal. Behavior is cooperative.    Assessment and Plan:   Primary hypertension Uncontrolled No red flags suggesting end-organ damage I do suspect that uncontrolled sleep apnea is also contributing to this, see below Continue norvasc 10 mg daily Start losartan- hydrochlorothiazide 50-12.5 mg daily  Monitor BP at home regularly, 1-2 times weekly I advised patient that if BP readings are consistently >150/90, to reach out to office and medication will be adjusted  Follow up in 3 months, sooner if concerns occur  Obesity, unspecified classification, unspecified obesity type, unspecified whether serious comorbidity present Encouraged patient to continue participation in daily exercise and making healthier dietary choices   Insulin resistance Update labs today, will start medication as indicated by results   - HgbA1c   - CBC with Differential/Platelet  - Comp Met   Hyperlipidemia, unspecified hyperlipidemia type Update labs today, will start medication as indicated   - Lipid Profile  Suspected sleep apnea Will refer to Sleep studies for further evaluation  Screening for HIV (human immunodeficiency virus) - HIV Antibody   Encounter for screening for other viral diseases  - Hepatitis C Antibody    I,Havlyn C Ratchford,acting as a scribe for [employer], PA.,have documented all relevant documentation on the behalf of Inda Coke, PA,as directed by  Inda Coke, PA while in the presence of Inda Coke, [place].  I, Inda Coke, [place], have reviewed all documentation for this visit. The documentation on 06/18/21 for the exam, diagnosis, procedures, and orders are all accurate and complete.  Inda Coke, PA-C

## 2021-06-18 NOTE — Patient Instructions (Signed)
It was great to see you!  Continue amlodipine 10 mg daily  Start losartan-hydrochlorothiazide 50-12.5 mg daily  If you have further concerns about your heart rate or develop new symptoms (chest pain, shortness of breath, etc.) please reach out to [place] or seek emergent medical care  Referral to sleep studies placed today, they will give you a call  Update your blood work today  Let's follow-up in 3 months, sooner if you have concerns.  If a referral was placed today, you will be contacted for an appointment. Please note that routine referrals can sometimes take up to 3-4 weeks to process. Please call our office if you haven't heard anything after this time frame.  Take care,  Jarold Motto PA-C

## 2021-06-19 ENCOUNTER — Encounter: Payer: Self-pay | Admitting: Physician Assistant

## 2021-06-26 ENCOUNTER — Telehealth: Payer: Self-pay | Admitting: *Deleted

## 2021-06-26 NOTE — Telephone Encounter (Signed)
Called pt about lab results from 06/18/2021 My Chart message that [facility] sent came back unread. Pt said he did review results and had a conversation with [place] about labs. Told pt sorry I can see message is there anything else you need? Pt said no just waiting on referral, but someone sent him a message about referral and he is just waiting for an appt. Told him okay.

## 2021-08-12 ENCOUNTER — Emergency Department (HOSPITAL_BASED_OUTPATIENT_CLINIC_OR_DEPARTMENT_OTHER): Payer: BC Managed Care – PPO | Admitting: Radiology

## 2021-08-12 ENCOUNTER — Emergency Department (HOSPITAL_BASED_OUTPATIENT_CLINIC_OR_DEPARTMENT_OTHER)
Admission: EM | Admit: 2021-08-12 | Discharge: 2021-08-13 | Disposition: A | Discharge status: Home / Self Care | Payer: BC Managed Care – PPO | Attending: Emergency Medicine | Admitting: Emergency Medicine

## 2021-08-12 ENCOUNTER — Encounter (HOSPITAL_BASED_OUTPATIENT_CLINIC_OR_DEPARTMENT_OTHER): Payer: Self-pay | Admitting: *Deleted

## 2021-08-12 ENCOUNTER — Other Ambulatory Visit: Payer: Self-pay

## 2021-08-12 DIAGNOSIS — I10 Essential (primary) hypertension: Secondary | ICD-10-CM | POA: Insufficient documentation

## 2021-08-12 DIAGNOSIS — Z79899 Other long term (current) drug therapy: Secondary | ICD-10-CM | POA: Insufficient documentation

## 2021-08-12 DIAGNOSIS — R079 Chest pain, unspecified: Secondary | ICD-10-CM

## 2021-08-12 DIAGNOSIS — R0789 Other chest pain: Secondary | ICD-10-CM | POA: Diagnosis not present

## 2021-08-12 DIAGNOSIS — R1013 Epigastric pain: Secondary | ICD-10-CM | POA: Diagnosis not present

## 2021-08-12 DIAGNOSIS — M25512 Pain in left shoulder: Secondary | ICD-10-CM | POA: Insufficient documentation

## 2021-08-12 DIAGNOSIS — M546 Pain in thoracic spine: Secondary | ICD-10-CM | POA: Insufficient documentation

## 2021-08-12 DIAGNOSIS — M25511 Pain in right shoulder: Secondary | ICD-10-CM | POA: Diagnosis not present

## 2021-08-12 LAB — CBC WITH DIFFERENTIAL/PLATELET
Abs Immature Granulocytes: 0.03 10*3/uL (ref 0.00–0.07)
Basophils Absolute: 0 10*3/uL (ref 0.0–0.1)
Basophils Relative: 0 %
Eosinophils Absolute: 0.1 10*3/uL (ref 0.0–0.5)
Eosinophils Relative: 1 %
HCT: 45.2 % (ref 39.0–52.0)
Hemoglobin: 15.3 g/dL (ref 13.0–17.0)
Immature Granulocytes: 0 %
Lymphocytes Relative: 19 %
Lymphs Abs: 2.1 10*3/uL (ref 0.7–4.0)
MCH: 29.5 pg (ref 26.0–34.0)
MCHC: 33.8 g/dL (ref 30.0–36.0)
MCV: 87.3 fL (ref 80.0–100.0)
Monocytes Absolute: 0.7 10*3/uL (ref 0.1–1.0)
Monocytes Relative: 7 %
Neutro Abs: 8.3 10*3/uL — ABNORMAL HIGH (ref 1.7–7.7)
Neutrophils Relative %: 73 %
Platelets: 323 10*3/uL (ref 150–400)
RBC: 5.18 MIL/uL (ref 4.22–5.81)
RDW: 12.5 % (ref 11.5–15.5)
WBC: 11.3 10*3/uL — ABNORMAL HIGH (ref 4.0–10.5)
nRBC: 0 % (ref 0.0–0.2)

## 2021-08-12 LAB — COMPREHENSIVE METABOLIC PANEL
ALT: 20 U/L (ref 0–44)
AST: 17 U/L (ref 15–41)
Albumin: 4.5 g/dL (ref 3.5–5.0)
Alkaline Phosphatase: 74 U/L (ref 38–126)
Anion gap: 12 (ref 5–15)
BUN: 13 mg/dL (ref 6–20)
CO2: 25 mmol/L (ref 22–32)
Calcium: 9.7 mg/dL (ref 8.9–10.3)
Chloride: 100 mmol/L (ref 98–111)
Creatinine, Ser: 0.79 mg/dL (ref 0.61–1.24)
GFR, Estimated: >60 mL/min (ref >60)
Glucose, Bld: 92 mg/dL (ref 70–99)
Potassium: 3.6 mmol/L (ref 3.5–5.1)
Sodium: 137 mmol/L (ref 135–145)
Total Bilirubin: 0.8 mg/dL (ref 0.3–1.2)
Total Protein: 7.7 g/dL (ref 6.5–8.1)

## 2021-08-12 LAB — TROPONIN I (HIGH SENSITIVITY): Troponin I (High Sensitivity): <2 ng/L (ref <18)

## 2021-08-12 LAB — PROTIME-INR
INR: 1 (ref 0.8–1.2)
Prothrombin Time: 13.1 seconds (ref 11.4–15.2)

## 2021-08-12 LAB — D-DIMER, QUANTITATIVE: D-Dimer, Quant: <0.27 ug/mL-FEU (ref 0.00–0.50)

## 2021-08-12 LAB — LIPASE, BLOOD: Lipase: 11 U/L (ref 11–51)

## 2021-08-12 NOTE — ED Triage Notes (Signed)
Pt states for 3 days he has had a "tightness" in his upper back in between his shoulder blades. "Sharp" pain in the upper abdomen that only lasted a couple of seconds this morning. Denies cough or shortness of breath.  ?

## 2021-08-12 NOTE — ED Notes (Signed)
Patient transported to X-ray 

## 2021-08-12 NOTE — ED Provider Notes (Incomplete)
[facility] EMERGENCY DEPT Provider Note   CSN: [id number] Arrival date & time: 08/12/21  2129     History {Add pertinent medical, surgical, social history, OB history to HPI:1} Chief Complaint  Patient presents with   Back Pain    Barry Walker is a 31 y.o. male.  HPI Patient has history of hypertension.  He reports that he tries to walk several times a week.  Sometimes he notes some pain up across the tops of the shoulders and his thoracic back.  He usually attributes it to musculoskeletal pain.  He denies he gets chest pain or shortness of breath when he is walking.  He reports yesterday he got the pain and it has persisted across the tops of the shoulders and thoracic back.  He reports today he then also got a sharp temporary pain in his left anterior chest.  He has not been experiencing shortness of breath, nausea, sweating or lightheadedness.  No swelling of the legs or pain behind the calves.  No history of PE or DVT.  Patient is a non-smoker.  No long travel.  No known family history of early coronary artery disease.    Home Medications Prior to Admission medications   Medication Sig Start Date End Date Taking? Authorizing Provider  amLODipine (NORVASC) 10 MG tablet Take 1 tablet (10 mg total) by mouth daily. 06/18/21  Yes Jarold Motto, PA  losartan-hydrochlorothiazide (HYZAAR) 50-12.5 MG tablet Take 1 tablet by mouth daily. 06/18/21  Yes Jarold Motto, PA  methocarbamol (ROBAXIN-750) 750 MG tablet Take 1 tablet (750 mg total) by mouth 4 (four) times daily. 08/13/21  Yes Arby Barrette, MD  Multiple Vitamin (MULTIVITAMIN) tablet Take 1 tablet by mouth daily.   Yes [provider]  naproxen sodium (ALEVE) 220 MG tablet Take 220 mg by mouth as needed.   Yes [provider]      Allergies    Penicillins    Review of Systems   Review of Systems 10 systems reviewed negative except as per HPI Physical Exam Updated Vital Signs BP 129/86  (BP Location: Right Arm)    Pulse 97    Temp 98.9 F (37.2 C) (Oral)    Resp 20    Ht 5\' 11"  (1.803 m)    Wt (!) 156.5 kg    SpO2 98%    BMI 48.12 kg/m  Physical Exam Constitutional:      Comments: Alert nontoxic no respiratory distress at rest.  HENT:     Mouth/Throat:     Pharynx: Oropharynx is clear.  Eyes:     Extraocular Movements: Extraocular movements intact.  Cardiovascular:     Comments: Tachycardia.  No rub murmur gallop. Pulmonary:     Effort: Pulmonary effort is normal.     Breath sounds: Normal breath sounds.  Abdominal:     General: There is no distension.     Palpations: Abdomen is soft.     Tenderness: There is no abdominal tenderness. There is no guarding.  Musculoskeletal:        General: No swelling or tenderness. Normal range of motion.     Right lower leg: No edema.     Left lower leg: No edema.     Comments: Calves and popliteal fossae nontender, no peripheral edema.  No significant reproducible pain in the thoracic back or trapezius.  Skin:    General: Skin is warm and dry.  Neurological:     General: No focal deficit present.     Mental  Status: He is oriented to person, place, and time.     Motor: No weakness.     Coordination: Coordination normal.  Psychiatric:        Mood and Affect: Mood normal.    ED Results / Procedures / Treatments   Labs (all labs ordered are listed, but only abnormal results are displayed) Labs Reviewed  CBC WITH DIFFERENTIAL/PLATELET - Abnormal; Notable for the following components:      Result Value   WBC 11.3 (*)    Neutro Abs 8.3 (*)    All other components within normal limits  COMPREHENSIVE METABOLIC PANEL  LIPASE, BLOOD  PROTIME-INR  D-DIMER, QUANTITATIVE  TROPONIN I (HIGH SENSITIVITY)  TROPONIN I (HIGH SENSITIVITY)    EKG EKG Interpretation  Date/Time:  "Sunday August 12 2021 21:39:22 EDT Ventricular Rate:  99 PR Interval:  103 QRS Duration: 100 QT Interval:  351 QTC Calculation: 451 R  Axis:   16 Text Interpretation: Sinus rhythm Low voltage, precordial leads Borderline T abnormalities, lateral leads agree, no old comparison Confirmed by Dalvin Clipper ([id number]) on 08/12/2021 10:22:29 PM  Radiology DG Chest 2 View  Result Date: 08/12/2021 CLINICAL DATA:  Chest pain. EXAM: CHEST - 2 VIEW COMPARISON:  None. FINDINGS: The heart size and mediastinal contours are within normal limits. Both lungs are clear. The visualized skeletal structures are unremarkable. IMPRESSION: No active cardiopulmonary disease. Electronically Signed   By: Arash  Radparvar M.D.   On: 08/12/2021 22:56    Procedures Procedures  {Document cardiac monitor, telemetry assessment procedure when appropriate:1} Monitor interpretation.  Patient has sinus rhythm narrow complex on the monitor.  Rates ranging high 90s to low 100s. Medications Ordered in ED Medications  acetaminophen (TYLENOL) tablet 1,000 mg (has no administration in time range)  methocarbamol (ROBAXIN) tablet 750 mg (has no administration in time range)    ED Course/ Medical Decision Making/ A&P                           Medical Decision Making Amount and/or Complexity of Data Reviewed Labs: ordered. Radiology: ordered.  Risk OTC drugs. Prescription drug management.   Patient presents with thoracic back pain and pain across the top of his shoulders.  He had an isolated incident of sharp anterior chest pain today.  Patient main cardiac risk factors are hypertension and obesity.  EKG shows some nonspecific lateral flattening.  No STEMI pattern.  We will proceed with diagnostic work-up for ACS\\PE\\musculoskeletal pain\\pneumonia\\epigastric causes such as pancreatitis or gallbladder disease.    {Document critical care time when appropriate:1} {Document review of labs and clinical decision tools ie heart score, Chads2Vasc2 etc:1}  {Document your independent review of radiology images, and any outside records:1} {Document your discussion with  family members, caretakers, and with consultants:1} {Document social determinants of health affecting pt's care:1} {Document your decision making why or why not admission, treatments were needed:1} Final Clinical Impression(s) / ED Diagnoses Final diagnoses:  Acute bilateral thoracic back pain  Chest pain, unspecified type    Rx / DC Orders ED Discharge Orders          Ordered    methocarbamol (ROBAXIN-750) 750 MG tablet  4 times daily        03" /13/23 0034

## 2021-08-13 MED ORDER — METHOCARBAMOL 750 MG PO TABS: 750.0000 mg | ORAL_TABLET | Freq: Four times a day (QID) | ORAL | 0 refills | Status: DC

## 2021-08-13 MED ORDER — ACETAMINOPHEN 500 MG PO TABS
1000.0000 mg | ORAL_TABLET | Freq: Once | ORAL | Status: AC
  Administered 2021-08-13: 1000 mg via ORAL
  Filled 2021-08-13: qty 2

## 2021-08-13 MED ORDER — METHOCARBAMOL 500 MG PO TABS
750.0000 mg | ORAL_TABLET | Freq: Once | ORAL | Status: AC
Start: 2021-08-13 — End: 2021-08-13
  Administered 2021-08-13: 750 mg via ORAL
  Filled 2021-08-13: qty 2

## 2021-08-13 NOTE — Discharge Instructions (Signed)
1.  Follow-up with your doctor in the next 3 to 5 days.  You should have reassessment for visit for back pain and chest pain. ?2.  You may take extra strength Tylenol every 6 hours for pain.  You may take Robaxin, a muscle relaxer, with the Tylenol. ?3.  At this time it seems most likely that your pain is musculoskeletal.  However you do have some risk factors for heart disease.  Return to the emergency department immediately if you develop nausea, sweaty, lightheadedness or other concerning symptoms. ?

## 2021-09-06 ENCOUNTER — Encounter: Payer: Self-pay | Admitting: Neurology

## 2021-09-06 ENCOUNTER — Ambulatory Visit (INDEPENDENT_AMBULATORY_CARE_PROVIDER_SITE_OTHER): Payer: BC Managed Care – PPO | Admitting: Neurology

## 2021-09-06 VITALS — BP 161/98 | HR 79 | Ht 70.0 in | Wt 364.8 lb

## 2021-09-06 DIAGNOSIS — R030 Elevated blood-pressure reading, without diagnosis of hypertension: Secondary | ICD-10-CM

## 2021-09-06 DIAGNOSIS — Z820 Family history of epilepsy and other diseases of the nervous system: Secondary | ICD-10-CM

## 2021-09-06 DIAGNOSIS — G4719 Other hypersomnia: Secondary | ICD-10-CM | POA: Diagnosis not present

## 2021-09-06 DIAGNOSIS — R0681 Apnea, not elsewhere classified: Secondary | ICD-10-CM | POA: Diagnosis not present

## 2021-09-06 DIAGNOSIS — R0683 Snoring: Secondary | ICD-10-CM | POA: Diagnosis not present

## 2021-09-06 DIAGNOSIS — R351 Nocturia: Secondary | ICD-10-CM

## 2021-09-06 DIAGNOSIS — Z9189 Other specified personal risk factors, not elsewhere classified: Secondary | ICD-10-CM

## 2021-09-06 NOTE — Patient Instructions (Signed)

## 2021-09-06 NOTE — Progress Notes (Signed)
Subjective:  ?  ?Patient ID: Barry Walker is a 31 y.o. male. ? ?HPI ? ? ? ?Star Age, MD, PhD ?[facility] ?[address], [address] ?[phone] ?[place], [place] [address] ? ?Dear Aldona Bar, ? ?I saw your patient, Barry Walker, upon your kind request in my neurologic clinic today for initial consultation of his sleep disorder, in particular, concern for underlying obstructive sleep apnea.  The patient is unaccompanied today.  As you know, Barry Walker is a 31 year old right-handed gentleman with an underlying medical history of hypertension, reflux disease, hyperlipidemia, and morbid obesity with a BMI of over 50, who reports snoring and excessive daytime somnolence as well as witnessed apneas per family and friends.  He shared a hotel room last year in the summer when he went to a wedding and was told that he stops breathing while asleep.  His father has sleep apnea and has a machine.  Patient endorses daytime somnolence and decreased daytime energy.  He has had weight fluctuation.  When he was exercising on a regular basis, he was able to lose weight.  He sprained his left ankle and had some difficulty exercising.  He has had blood pressure fluctuation but at home his blood pressure numbers have been better.  He does not drink caffeine daily.  He is single and lives alone, no pets in the house.  He does have a TV in his bedroom and puts it on a 60-minute timer.  He works second shift essentially.  He works Mondays through Fridays from 2:30 PM to midnight.  He has a 15 to 20-minute commute.  He is typically in bed between 2:15 AM and 3 AM on most nights and rise time is around 11 AM.  He has nocturia once per average night and denies any recurrent morning headaches.  He drinks alcohol occasionally on the weekends.  He is a non-smoker.  He thought he had a tonsillectomy as a child but is not 100% sure.  I reviewed your office note from 06/18/2021.  His Epworth sleepiness score is 11 out of  24, fatigue severity score is 43 out of 63. ? ?His Past Medical History Is Significant For: ?Past Medical History:  ?Diagnosis Date  ? Allergy [birth year]  ? Penicilin  ? GERD (gastroesophageal reflux disease) 12/15/2020  ? HLD (hyperlipidemia) 08/22/2020  ? Hypertension 08/22/2020  ? 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 ? ?His Past Surgical History Is Significant For: ?No past surgical history on file. ? ?His Family History Is Significant For: ?Family History  ?Problem Relation Age of Onset  ? ADD / ADHD Mother   ? Anxiety disorder Mother   ? Depression Mother   ? Hypertension Father   ? Sleep apnea Father   ? ADD / ADHD Sister   ? Colon cancer Maternal Grandfather 80  ? Prostate cancer Neg Hx   ? Breast cancer Neg Hx   ? Stroke Neg Hx   ? Heart attack Neg Hx   ? ? ?His Social History Is Significant For: ?Social History  ? ?Socioeconomic History  ? Marital status: Single  ?  Spouse name: Not on file  ? Number of children: Not on file  ? Years of education: Not on file  ? Highest education level: Not on file  ?Occupational History  ? Not on file  ?Tobacco Use  ? Smoking status: Never  ? Smokeless tobacco: Never  ? Tobacco comments:  ?  A cigar on special occasions, but no cigs/vape  ?Vaping  Use  ? Vaping Use: Never used  ?Substance and Sexual Activity  ? Alcohol use: Yes  ?  Alcohol/week: 3.0 standard drinks  ?  Types: 3 Standard drinks or equivalent per week  ?  Comment: Only drink on Saturday during a normal week  ? Drug use: Never  ? Sexual activity: Not Currently  ?  Birth control/protection: Condom  ?Other Topics Concern  ? Not on file  ?Social History Narrative  ? Heritage manager for [employer]  ? Grew up in [place]  ? Has been in [place] since sept 2018.    ? Caffeine water, sugar free lemonade.   ? ?Social Determinants of Health  ? ?Financial Resource Strain: Not on file  ?Food Insecurity: Not on file  ?Transportation Needs: Not on file  ?Physical Activity: Not on file  ?Stress: Not on file  ?Social Connections: Not on file   ? ? ?His Allergies Are:  ?Allergies  ?Allergen Reactions  ? Penicillins   ?  Reaction = unknown, was told as a child he had a reaction to it.   ?:  ? ?His Current Medications Are:  ?Outpatient Encounter Medications as of 09/06/2021  ?Medication Sig  ? amLODipine (NORVASC) 10 MG tablet Take 1 tablet (10 mg total) by mouth daily.  ? losartan-hydrochlorothiazide (HYZAAR) 50-12.5 MG tablet Take 1 tablet by mouth daily.  ? Multiple Vitamin (MULTIVITAMIN) tablet Take 1 tablet by mouth daily.  ? naproxen sodium (ALEVE) 220 MG tablet Take 220 mg by mouth as needed.  ? [DISCONTINUED] methocarbamol (ROBAXIN-750) 750 MG tablet Take 1 tablet (750 mg total) by mouth 4 (four) times daily.  ? ?No facility-administered encounter medications on file as of 09/06/2021.  ?: ? ? ?Review of Systems:  ?Out of a complete 14 point review of systems, all are reviewed and negative with the exception of these symptoms as listed below:  ? ?Review of Systems  ?Neurological:   ?     Suspected sleep apnea. Snoring, witnessed apnea,  dozing off at work. ESS 11, FSS 43.  ? ?Objective:  ?Neurological Exam ? ?Physical Exam ?Physical Examination:  ? ?Vitals:  ? 09/06/21 0958  ?BP: (!) 161/98  ?Pulse: 79  ? ? ?General Examination: The patient is a very pleasant 31 y.o. male in no acute distress. He appears well-developed and well-nourished and well groomed.  ? ?HEENT: Normocephalic, atraumatic, pupils are equal, round and reactive to light, extraocular tracking is good without limitation to gaze excursion or nystagmus noted. Hearing is grossly intact. Face is symmetric with normal facial animation. Speech is clear with no dysarthria noted. There is no hypophonia. There is no lip, neck/head, jaw or voice tremor. Neck is supple with full range of passive and active motion. There are no carotid bruits on auscultation. Oropharynx exam reveals: mild mouth dryness, good dental hygiene and moderate airway crowding, due to small airway, tonsillar size of about 2+,  larger uvula.  Mallampati class IV, neck circumference of 19-1/2 inches.  He has a minimal overbite.  Tongue protrudes centrally and palate elevates symmetrically. ? ?Chest: Clear to auscultation without wheezing, rhonchi or crackles noted. ? ?Heart: S1+S2+0, regular and normal without murmurs, rubs or gallops noted.  ? ?Abdomen: Soft, non-tender and non-distended with normal bowel sounds appreciated on auscultation. ? ?Extremities: There is mild swelling in the distal left lower extremity around the ankle.   ? ?Skin: Warm and dry without trophic changes noted.  ? ?Musculoskeletal: exam reveals no obvious joint deformities.  ? ?Neurologically:   ?  Mental status: The patient is awake, alert and oriented in all 4 spheres. His immediate and remote memory, attention, language skills and fund of knowledge are appropriate. There is no evidence of aphasia, agnosia, apraxia or anomia. Speech is clear with normal prosody and enunciation. Thought process is linear. Mood is normal and affect is normal.  ?Cranial nerves II - XII are as described above under HEENT exam.  ?Motor exam: Normal bulk, strength and tone is noted. There is no tremor. Fine motor skills and coordination: grossly intact.  ?Cerebellar testing: No dysmetria or intention tremor. There is no truncal or gait ataxia.  ?Sensory exam: intact to light touch in the upper and lower extremities.  ?Gait, station and balance: He stands easily. No veering to one side is noted. No leaning to one side is noted. Posture is age-appropriate and stance is narrow based. Gait shows normal stride length and normal pace. No problems turning are noted.  ? ?Assessment and Plan:  ?In summary, Barry Walker is a very pleasant 31 y.o.-year old male with an underlying medical history of hypertension, reflux disease, hyperlipidemia, and morbid obesity with a BMI of over 50, whose history and physical exam are concerning for obstructive sleep apnea (OSA). ?I had a long chat with the  patient about my findings and the diagnosis of OSA, its prognosis and treatment options. We talked about medical treatments, surgical interventions and non-pharmacological approaches. I explained in particu

## 2021-09-18 ENCOUNTER — Encounter: Payer: Self-pay | Admitting: Physician Assistant

## 2021-09-18 ENCOUNTER — Ambulatory Visit: Payer: BC Managed Care – PPO | Admitting: Physician Assistant

## 2021-09-18 VITALS — BP 142/96 | HR 111 | Temp 98.3°F | Ht 70.0 in | Wt 365.2 lb

## 2021-09-18 DIAGNOSIS — F339 Major depressive disorder, recurrent, unspecified: Secondary | ICD-10-CM | POA: Diagnosis not present

## 2021-09-18 DIAGNOSIS — I10 Essential (primary) hypertension: Secondary | ICD-10-CM | POA: Diagnosis not present

## 2021-09-18 LAB — BASIC METABOLIC PANEL WITH GFR
BUN: 12 mg/dL (ref 6–23)
CO2: 24 meq/L (ref 19–32)
Calcium: 9.3 mg/dL (ref 8.4–10.5)
Chloride: 100 meq/L (ref 96–112)
Creatinine, Ser: 0.68 mg/dL (ref 0.40–1.50)
GFR: 126.5 mL/min
Glucose, Bld: 154 mg/dL — ABNORMAL HIGH (ref 70–99)
Potassium: 3.6 meq/L (ref 3.5–5.1)
Sodium: 135 meq/L (ref 135–145)

## 2021-09-18 MED ORDER — LOSARTAN POTASSIUM-HCTZ 100-12.5 MG PO TABS: 1.0000 | ORAL_TABLET | Freq: Every day | ORAL | 1 refills | Status: DC

## 2021-09-18 MED ORDER — BUPROPION HCL ER (XL) 150 MG PO TB24: 150.0000 mg | ORAL_TABLET | Freq: Every day | ORAL | 0 refills | Status: DC

## 2021-09-18 NOTE — Progress Notes (Signed)
Barry Walker is a 31 y.o. male here for a follow up of a pre-existing problem. ? ?SCRIBE STATEMENT ? ?History of Present Illness:  ? ?Chief Complaint  ?Patient presents with  ? Follow-up  ? Hypertension  ?  Pt returning for 3 mon f/u for hypertension; pt keeping track at home of bp readings and getting as low as 139/87 and high as 160/97. Been checking bp every morning before leaving for work so checking same time daily.   ? ? ?Hypertension ? ?HTN ?Greogry has been compliant with taking losartan-HCTZ 50-12.5 mg daily and norvasc  10 mg daily with no complications. At home blood pressure readings are: checked following taking his medication and prior to going to work in the AM. According to pt, systolic usually ranges from [id number] and diastolic ranges from [id number]. He does admit that his BP has gotten as high as 160/97, but has also gotten as low as 139/87. Nyzier has been trying to adjust his lifestyle by making healthier diet choices and getting regular exercise. Patient denies chest pain, SOB, blurred vision, dizziness, unusual headaches, lower leg swelling. Denies excessive caffeine intake, stimulant usage, excessive alcohol intake, or increase in salt consumption. ? ?BP Readings from Last 3 Encounters:  ?09/18/21 (!) 142/96  ?09/06/21 (!) 161/98  ?08/13/21 (!) 142/88  ? ?Anxiety/Depression ?Although pt reports his mood is not as bad as it was years ago, he is still experiencing days where he feels unmotivated and doesn't want to get out of bed. He has never trialed medication and although he is interested in doing so, he is hesitant. Pt explains that when his mother started medication, she did experience relief but didn't feel like herself. At this time he does want to start a medication but doesn't want to not feel like himself at all. He is also interested in receiving a referral for talk therapy. Denies SI/HI.  ? ? ?Suspected Sleep Apnea  ?Since our previous visit, pt has followed up with Dr. Rexene Alberts, neurology  for further evaluation. According to pt, he was told that his air passageway is smaller than normal, which could be contributing to this issue. He is set to complete a home sleep test once insurance gets organized on the office's side of things.  ?Past Medical History:  ?Diagnosis Date  ? Allergy 19[birth year]1992  ? Penicilin  ? GERD (gastroesophageal reflux disease) 12/15/2020  ? HLD (hyperlipidemia) 08/22/2020  ? Hypertension 08/22/2020  ? 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 ?  ?Social History  ? ?Tobacco Use  ? Smoking status: Never  ? Smokeless tobacco: Never  ? Tobacco comments:  ?  A cigar on special occasions, but no cigs/vape  ?Vaping Use  ? Vaping Use: Never used  ?Substance Use Topics  ? Alcohol use: Yes  ?  Alcohol/week: 3.0 standard drinks  ?  Types: 3 Standard drinks or equivalent per week  ?  Comment: Only drink on Saturday during a normal week  ? Drug use: Never  ? ? ?No past surgical history on file. ? ?Family History  ?Problem Relation Age of Onset  ? ADD / ADHD Mother   ? Anxiety disorder Mother   ? Depression Mother   ? Hypertension Father   ? Sleep apnea Father   ? ADD / ADHD Sister   ? Colon cancer Maternal Grandfather 35  ? Prostate cancer Neg Hx   ? Breast cancer Neg Hx   ? Stroke Neg Hx   ? Heart attack Neg Hx   ? ? ?  Allergies  ?Allergen Reactions  ? Penicillins   ?  Reaction = unknown, was told as a child he had a reaction to it.   ? ? ?Current Medications:  ? ?Current Outpatient Medications:  ?  acetaminophen (TYLENOL) 500 MG tablet, Take 500 mg by mouth every 6 (six) hours as needed., Disp: , Rfl:  ?  amLODipine (NORVASC) 10 MG tablet, Take 1 tablet (10 mg total) by mouth daily., Disp: 90 tablet, Rfl: 1 ?  losartan-hydrochlorothiazide (HYZAAR) 50-12.5 MG tablet, Take 1 tablet by mouth daily., Disp: 90 tablet, Rfl: 1 ?  Multiple Vitamin (MULTIVITAMIN) tablet, Take 1 tablet by mouth daily., Disp: , Rfl:  ?  naproxen sodium (ALEVE) 220 MG tablet, Take 220 mg by mouth as needed. (Patient not taking: Reported  on 09/18/2021), Disp: , Rfl:   ? ?Review of Systems:  ? ?ROS ?Negative unless otherwise specified per HPI. ?Vitals:  ? ?Vitals:  ? 09/18/21 1056  ?BP: (!) 142/96  ?Pulse: (!) 111  ?Temp: 98.3 ?F (36.8 ?C)  ?TempSrc: Temporal  ?SpO2: 99%  ?Weight: (!) 365 lb 3.2 oz (165.7 kg)  ?Height: 5\' 10"  (1.778 m)  ?   ?Body mass index is 52.4 kg/m?. ? ?Physical Exam:  ? ?Physical Exam ?Vitals and nursing note reviewed.  ?Constitutional:   ?   General: He is not in acute distress. ?   Appearance: He is well-developed. He is not ill-appearing or toxic-appearing.  ?Cardiovascular:  ?   Rate and Rhythm: Normal rate and regular rhythm.  ?   Pulses: Normal pulses.  ?   Heart sounds: Normal heart sounds, S1 normal and S2 normal.  ?Pulmonary:  ?   Effort: Pulmonary effort is normal.  ?   Breath sounds: Normal breath sounds.  ?Skin: ?   General: Skin is warm and dry.  ?Neurological:  ?   Mental Status: He is alert.  ?   GCS: GCS eye subscore is 4. GCS verbal subscore is 5. GCS motor subscore is 6.  ?Psychiatric:     ?   Speech: Speech normal.     ?   Behavior: Behavior normal. Behavior is cooperative.  ? ? ?Assessment and Plan:  ? ?Depression, recurrent ([place]) ?Uncontrolled ?No red flags;Denies SI/HI ?Start Wellbutrin XL 150 mg daily  ?Placed referral to talk therapy  ?I advised patient that if they develop any SI, to tell someone immediately and seek medical attention ?Follow up in 3 months, sooner if concerns ? ?Primary hypertension ?Uncontrolled ?Increase losartan- HCTZ to 100-12.5 mg daily ?Maintain norvasc 10 mg daily  ?Update BMP ?Monitor BP regularly 1-2 times a week ?Encouraged patient to continue participating in healthy eating and daily exercise ?I advised patient that if BP readings are consistently >150/100, to reach out to office and medication will be adjusted ?Follow up in 3 months, sooner if concerns ? ?I,Havlyn C Ratchford,acting as a scribe for [employer], PA.,have documented all relevant documentation on the behalf  of Inda Coke, PA,as directed by  Inda Coke, PA while in the presence of Inda Coke, [place]. ? ?IInda Coke, PA, have reviewed all documentation for this visit. The documentation on 09/18/21 for the exam, diagnosis, procedures, and orders are all accurate and complete. ? ? ?Inda Coke, PA-C ? ?

## 2021-09-18 NOTE — Patient Instructions (Signed)
It was great to see you! ? ?Start Wellbutrin 150 mg XL daily ?Tell someone you trust about this medication ?Talk therapy referral placed ?Also consider reaching out to [facility] (see card for [employer]) ? ?Increase BP medication to Losartan-HCTZ 100-12.5 mg daily ?Continue norvasc 10 mg ? ?Let's follow-up in 3 months, sooner if you have concerns. ? ?If a referral was placed today, you will be contacted for an appointment. Please note that routine referrals can sometimes take up to 3-4 weeks to process. Please call our office if you haven't heard anything after this time frame. ? ?Take care, ? ?Jarold Motto PA-C  ?

## 2021-10-02 ENCOUNTER — Encounter: Payer: Self-pay | Admitting: Neurology

## 2021-10-12 ENCOUNTER — Encounter: Payer: Self-pay | Admitting: Physician Assistant

## 2021-10-12 ENCOUNTER — Other Ambulatory Visit: Payer: Self-pay | Admitting: Physician Assistant

## 2021-10-24 ENCOUNTER — Ambulatory Visit (INDEPENDENT_AMBULATORY_CARE_PROVIDER_SITE_OTHER): Payer: BC Managed Care – PPO | Admitting: Psychologist

## 2021-10-24 DIAGNOSIS — F320 Major depressive disorder, single episode, mild: Secondary | ICD-10-CM | POA: Diagnosis not present

## 2021-10-24 NOTE — Plan of Care (Signed)

## 2021-10-24 NOTE — Progress Notes (Signed)
[facility] Counselor Initial Adult Exam  Name: Barry Walker Date: 10/24/2021 MRN: [id number] DOB: 03-18-1991 PCP: Jarold Motto, PA  Time spent: 9:06 am to 9:36 am/ total time: 30 minutes  This session was held via in person. The patient consented to in-person therapy and was in the clinician's office. Limits of confidentiality were discussed with the patient.   Guardian/Payee:  NA    Paperwork requested: No   Reason for Visit /Presenting Problem: Depression  Mental Status Exam: Appearance:   Casual     Behavior:  Appropriate  Motor:  Normal  Speech/Language:   Clear and Coherent  Affect:  Appropriate  Mood:  normal  Thought process:  normal  Thought content:    WNL  Sensory/Perceptual disturbances:    WNL  Orientation:  oriented to person, place, and time/date  Attention:  Good  Concentration:  Good  Memory:  WNL  Fund of knowledge:   Good  Insight:    Fair  Judgment:   Good  Impulse Control:  Good    Reported Symptoms:  The patient endorsed experiencing the following: feeling down, sad, lack of motivation, fatigue, rumination of negative thoughts, social isolation, avoiding pleasurable activity, and low self-esteem. He denied suicidal and homicidal ideation.   Risk Assessment: Danger to Self:  No Self-injurious Behavior: No Danger to Others: No Duty to Warn:no Physical Aggression / Violence:No  Access to Firearms a concern: No  Gang Involvement:No  Patient / guardian was educated about steps to take if suicide or homicide risk level increases between visits: n/a While future psychiatric events cannot be accurately predicted, the patient does not currently require acute inpatient psychiatric care and does not currently meet [facility] involuntary commitment criteria.  Substance Abuse History: Current substance abuse: No     Past Psychiatric History:   No previous psychological problems have been observed Outpatient Providers:NA History  of Psych Hospitalization: No  Psychological Testing:  NA    Abuse History:  Victim of: No.,  NA    Report needed: No. Victim of Neglect:No. Perpetrator of  NA   Witness / Exposure to Domestic Violence: No   Protective Services Involvement: No  Witness to [employer] Violence:  No   Family History:  Family History  Problem Relation Age of Onset   ADD / ADHD Mother    Anxiety disorder Mother    Depression Mother    Post-traumatic stress disorder Mother    Hypertension Father    Sleep apnea Father    ADD / ADHD Sister    Colon cancer Maternal Grandfather 87   Prostate cancer Neg Hx    Breast cancer Neg Hx    Stroke Neg Hx    Heart attack Neg Hx     Living situation: the patient lives alone  Sexual Orientation: Straight  Relationship Status: single  Name of spouse / other:NA If a parent, number of children / ages:NA  Support Systems: Family  Financial Stress:  No   Income/Employment/Disability: Employment  Financial planner: No   Educational History: Education: Risk manager: Agnostic  Any cultural differences that may affect / interfere with treatment:  not applicable   Recreation/Hobbies: Spending time with friends and going to live music events  Stressors: Other: Work    Strengths: Supportive Relationships and Family  Barriers:  NA   Legal History: Pending legal issue / charges: The patient has no significant history of legal issues. History of legal issue / charges:  NA  Medical History/Surgical History:  Reviewed Past Medical History:  Diagnosis Date   Allergy Jul 04, 1992   Penicilin   GERD (gastroesophageal reflux disease) 12/15/2020   HLD (hyperlipidemia) 08/22/2020   Hypertension 08/22/2020   Rectus diastasis 12/15/2020    No past surgical history on file.  Medications: Current Outpatient Medications  Medication Sig Dispense Refill   acetaminophen (TYLENOL) 500 MG tablet Take 500 mg by mouth every 6 (six)  hours as needed.     amLODipine (NORVASC) 10 MG tablet Take 1 tablet (10 mg total) by mouth daily. 90 tablet 1   buPROPion (WELLBUTRIN XL) 150 MG 24 hr tablet Take 1 tablet (150 mg total) by mouth daily. 90 tablet 0   losartan-hydrochlorothiazide (HYZAAR) 100-12.5 MG tablet TAKE 1 TABLET BY MOUTH EVERY DAY 30 tablet 1   Multiple Vitamin (MULTIVITAMIN) tablet Take 1 tablet by mouth daily.     naproxen sodium (ALEVE) 220 MG tablet Take 220 mg by mouth as needed. (Patient not taking: Reported on 09/18/2021)     No current facility-administered medications for this visit.    Allergies  Allergen Reactions   Penicillins     Reaction = unknown, was told as a child he had a reaction to it.     Diagnoses:  F32.0 Major depressive affective disorder, single episode, mild  Plan of Care: The patient is a 31 year old Caucasian male who was referred for counseling due to experiencing depression. The patient lives alone. 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 He denied suicidal and homicidal ideation.   The patient stated that he wants to be happy again, to learn more about his emotions and express them, to understand values  This psychologist makes the recommendation that the patient participate in therapy at least once a month to assist in meeting his needs.    Hilbert Corrigan, PsyD

## 2021-10-24 NOTE — Progress Notes (Signed)
                Malayjah Otoole, PsyD 

## 2021-10-26 ENCOUNTER — Telehealth: Payer: Self-pay | Admitting: Neurology

## 2021-10-26 NOTE — Telephone Encounter (Signed)
LVM for pt to call back to schedule  no auth req ref # [id number]

## 2021-10-30 NOTE — Telephone Encounter (Signed)
LVM for pt to call back to schedule.

## 2021-10-30 NOTE — Telephone Encounter (Signed)
Patient returned my call he is scheduled for 11/07/21 at 1:30 pm. [employer] no auth req.

## 2021-11-07 ENCOUNTER — Ambulatory Visit: Payer: BC Managed Care – PPO | Admitting: Neurology

## 2021-11-07 DIAGNOSIS — R351 Nocturia: Secondary | ICD-10-CM

## 2021-11-07 DIAGNOSIS — R0681 Apnea, not elsewhere classified: Secondary | ICD-10-CM

## 2021-11-07 DIAGNOSIS — Z820 Family history of epilepsy and other diseases of the nervous system: Secondary | ICD-10-CM

## 2021-11-07 DIAGNOSIS — G4733 Obstructive sleep apnea (adult) (pediatric): Secondary | ICD-10-CM | POA: Diagnosis not present

## 2021-11-07 DIAGNOSIS — G4734 Idiopathic sleep related nonobstructive alveolar hypoventilation: Secondary | ICD-10-CM

## 2021-11-07 DIAGNOSIS — G4719 Other hypersomnia: Secondary | ICD-10-CM

## 2021-11-07 DIAGNOSIS — Z9189 Other specified personal risk factors, not elsewhere classified: Secondary | ICD-10-CM

## 2021-11-07 DIAGNOSIS — R030 Elevated blood-pressure reading, without diagnosis of hypertension: Secondary | ICD-10-CM

## 2021-11-07 DIAGNOSIS — R0683 Snoring: Secondary | ICD-10-CM

## 2021-11-08 ENCOUNTER — Other Ambulatory Visit: Payer: Self-pay | Admitting: Physician Assistant

## 2021-11-14 NOTE — Progress Notes (Signed)
See procedure note.

## 2021-11-15 ENCOUNTER — Telehealth: Payer: Self-pay | Admitting: *Deleted

## 2021-11-15 NOTE — Procedures (Signed)
   [facility]  HOME SLEEP TEST (Watch PAT) REPORT  STUDY DATE: 11/08/2021  DOB: [birth year]  MRN: [id number]  ORDERING CLINICIAN: Huston Foley, MD, PhD   REFERRING CLINICIAN: Jarold Motto, [place]   CLINICAL INFORMATION/HISTORY: 31 year old right-handed gentleman with an underlying medical history of hypertension, reflux disease, hyperlipidemia, and morbid obesity with a BMI of over 50, who reports snoring and excessive daytime somnolence as well as witnessed apneas.  Epworth sleepiness score: 11/24.  BMI: 52.4 kg/m  FINDINGS:   Sleep Summary:   Total Recording Time (hours, min): 7 hours, 9 minutes  Total Sleep Time (hours, min):  6 hours, 19 minutes   Percent REM (%):    25.5%   Respiratory Indices:   Calculated pAHI (per hour):  79.5/hour         REM pAHI:    61.6/hour       NREM pAHI: 83.9/hour  Oxygen Saturation Statistics:    Oxygen Saturation (%) Mean: 93%   Minimum oxygen saturation (%):                 72%   O2 Saturation Range (%): 72-100%    O2 Saturation (minutes) <=88%: 33.4 min  Pulse Rate Statistics:   Pulse Mean (bpm):    71/min    Pulse Range (46-120/min)   IMPRESSION: OSA (obstructive sleep apnea), severe Nocturnal hypoxemia  RECOMMENDATION:  This home sleep test demonstrates severe obstructive sleep apnea with a total AHI of 79.5/hour and O2 nadir of 72% with significant time below or at 88% saturation of over 30 minutes for the night, indicating nocturnal hypoxemia.  Snoring was detected in the mild to moderate range mostly. Treatment with positive airway pressure is highly recommended. This will require - ideally - a full night CPAP titration study for proper treatment settings, O2 monitoring and mask fitting. For now, the patient will be advised to proceed with an autoPAP titration/trial at home.  A laboratory attended titration study can be considered in the future for optimization of his treatment and better tolerance  of therapy.  Alternative treatment options are limited secondary to the severity of the patient's sleep disordered breathing.  Concomitant weight loss is highly recommended.  Please note, that untreated obstructive sleep apnea may carry additional perioperative morbidity. Patients with significant obstructive sleep apnea should receive perioperative PAP therapy and the surgeons and particularly the anesthesiologist should be informed of the diagnosis and the severity of the sleep disordered breathing. The patient should be cautioned not to drive, work at heights, or operate dangerous or heavy equipment when tired or sleepy. Review and reiteration of good sleep hygiene measures should be pursued with any patient. Other causes of the patient's symptoms, including circadian rhythm disturbances, an underlying mood disorder, medication effect and/or an underlying medical problem cannot be ruled out based on this test. Clinical correlation is recommended. The patient and his referring provider will be notified of the test results. The patient will be seen in follow up in sleep clinic at [facility].  I certify that I have reviewed the raw data recording prior to the issuance of this report in accordance with the standards of the American Academy of Sleep Medicine (AASM).  INTERPRETING PHYSICIAN:   Huston Foley, MD, PhD  Board Certified in Neurology and Sleep Medicine  [facility] [address] [place], [place] [address] [phone]

## 2021-11-15 NOTE — Telephone Encounter (Signed)
-----   Message from Huston Foley, MD sent at 11/15/2021  4:30 PM EDT ----- Urgent set up requested. Patient referred by PCP, seen by me on 09/06/2021, patient had a HST on 11/08/2021.    Please call and notify the patient that the recent home sleep test showed obstructive sleep apnea in the severe range. I recommend treatment for this in the form of autoPAP, which means, that we don't have to bring him in for a sleep study with CPAP, but will let him start using a so called autoPAP machine at home, through a DME company (of his choice, or as per insurance requirement). The DME representative will fit the patient with a mask of choice, educate him on how to use the machine, how to put the mask on, etc. I have placed an order in the chart. Please send the order to a local DME, talk to patient, send report to referring MD. Please also reinforce the need for compliance with treatment. We will need a FU in sleep clinic for 10 weeks post-PAP set up, please arrange that with me or one of our NPs. Thanks,   Huston Foley, MD, PhD [facility])

## 2021-11-15 NOTE — Addendum Note (Signed)
Addended by: Huston Foley on: 11/15/2021 04:30 PM   Modules accepted: Orders

## 2021-11-15 NOTE — Telephone Encounter (Signed)
Spoke with patient and discussed sleep study results. Patient is amenable to proceeding with autopap and is aware of main difference between that and CPAP. Pt is aware the results showed severe OSA and urgent setup has been requested. Discussed DME. Will use [facility]. We discussed insurance compliance requirements which include using machine at least 4 hours at night and also being seen in our office between 30 and 90 days after setup. Initial compliance appt has been scheduled for [facility] 8/23 @ 11:15 AM. Questions were answered. Pt verbalized understanding and appreciation.   Order, insurance info, sleep study, and office note faxed to [facility]. Received a receipt of confirmation. F/u letter sent to patient via mychart.

## 2021-11-16 ENCOUNTER — Ambulatory Visit (INDEPENDENT_AMBULATORY_CARE_PROVIDER_SITE_OTHER): Payer: BC Managed Care – PPO | Admitting: Psychologist

## 2021-11-16 DIAGNOSIS — F320 Major depressive disorder, single episode, mild: Secondary | ICD-10-CM

## 2021-11-16 NOTE — Progress Notes (Signed)
                Oluwatosin Bracy, PsyD 

## 2021-11-16 NOTE — Progress Notes (Signed)
[facility] Counselor/Therapist Progress Note  Patient ID: Barry Walker, MRN: [id number],    Date: 11/16/2021  Time Spent: 2:02 pm to 2:42 pm; total time: 40 minutes   This session was held via video webex teletherapy due to the coronavirus risk at this time. The patient consented to video teletherapy and was located at his home during this session. He is aware it is the responsibility of the patient to secure confidentiality on his end of the session. The provider was in a private home office for the duration of this session. Limits of confidentiality were discussed with the patient.   Treatment Type: Individual Therapy  Reported Symptoms: Depression  Mental Status Exam: Appearance:  Well Groomed     Behavior: Appropriate  Motor: Normal  Speech/Language:  Clear and Coherent  Affect: Appropriate  Mood: normal  Thought process: normal  Thought content:   WNL  Sensory/Perceptual disturbances:   WNL  Orientation: oriented to person, place, and time/date  Attention: Good  Concentration: Good  Memory: WNL  Fund of knowledge:  Good  Insight:   Fair  Judgment:  Fair  Impulse Control: Good   Risk Assessment: Danger to Self:  No Self-injurious Behavior: No Danger to Others: No Duty to Warn:no Physical Aggression / Violence:No  Access to Firearms a concern: No  Gang Involvement:No   Subjective: Beginning the session, patient described himself as doing okay. After reviewing the treatment plan, patient stated that he wanted to work towards disconnecting from work. Patient spent part of the session reflecting on ways to disconnect from work. He then spent time exploring ways to be more physically active. He also participate in defusion, describing it as helpful.  He denied suicidal and homicidal ideation.   Interventions:  Worked on developing a therapeutic relationship with the patient using active listening and reflective statements. Provided emotional support using  empathy and validation. Reviewed the treatment plan with the patient. Reviewed events since the intake. Identified goals for the session. Reflected on the different parts of self patient expressed. Specifically, reflected on the part that wants to disconnect from work and the other part that wants to connect. Used socratic questions to assist the patient. Challenged some of the thoughts expressed. Provided psychoeducation about defusion. Practiced and processed defusion. Praised patient for experiencing less distress. Assisted in ways that patient can be more physically active. Identified barriers and ways to overcome those barriers. Assigned homework. Assessed for suicidal and homicidal ideation.   Homework: Implement defusion and exercise outside more often  Next Session: Review homework and emotional support.   Diagnosis: F32.0 major depressive affective disorder, single episode, mild  Plan:   Goals Alleviate depressive symptoms Recognize, accept, and cope with depressive feelings Develop healthy thinking patterns Develop healthy interpersonal relationships  Objectives target date for all objectives is 10/25/2022 Cooperate with a medication evaluation by a physician Verbalize an accurate understanding of depression Verbalize an understanding of the treatment Identify and replace thoughts that support depression Learn and implement behavioral strategies Verbalize an understanding and resolution of current interpersonal problems Learn and implement problem solving and decision making skills Learn and implement conflict resolution skills to resolve interpersonal problems Verbalize an understanding of healthy and unhealthy emotions verbalize insight into how past relationships may be influence current experiences with depression Use mindfulness and acceptance strategies and increase value based behavior  Increase hopeful statements about the future.  Interventions Consistent with treatment  model, discuss how change in cognitive, behavioral, and interpersonal can help client alleviate depression  CBT Behavioral activation help the client explore the relationship, nature of the dispute,  Help the client develop new interpersonal skills and relationships Conduct Problem so living therapy Teach conflict resolution skills Use a process-experiential approach Conduct TLDP Conduct ACT Evaluate need for psychotropic medication Monitor adherence to medication   The patient and clinician reviewed the treatment plan on 11/16/2021. The patient approved of the treatment plan.   Hilbert Corrigan, PsyD

## 2021-12-04 ENCOUNTER — Other Ambulatory Visit: Payer: Self-pay | Admitting: Physician Assistant

## 2021-12-11 ENCOUNTER — Other Ambulatory Visit: Payer: Self-pay | Admitting: Physician Assistant

## 2021-12-11 ENCOUNTER — Ambulatory Visit (INDEPENDENT_AMBULATORY_CARE_PROVIDER_SITE_OTHER): Payer: BC Managed Care – PPO | Admitting: Psychologist

## 2021-12-11 DIAGNOSIS — F320 Major depressive disorder, single episode, mild: Secondary | ICD-10-CM | POA: Diagnosis not present

## 2021-12-11 NOTE — Progress Notes (Signed)
[facility] Counselor/Therapist Progress Note  Patient ID: Barry Walker, MRN: [id number],    Date: 12/11/2021  Time Spent: 10:03 am to 10:44 am; total time: 41 minutes   This session was held via in person. The patient consented to in-person therapy and was in the clinician's office. Limits of confidentiality were discussed with the patient.   Treatment Type: Individual Therapy  Reported Symptoms: Less depressive symptoms  Mental Status Exam: Appearance:  Well Groomed     Behavior: Appropriate  Motor: Normal  Speech/Language:  Clear and Coherent  Affect: Appropriate  Mood: normal  Thought process: normal  Thought content:   WNL  Sensory/Perceptual disturbances:   WNL  Orientation: oriented to person, place, and time/date  Attention: Good  Concentration: Good  Memory: WNL  Fund of knowledge:  Good  Insight:   Fair  Judgment:  Fair  Impulse Control: Good   Risk Assessment: Danger to Self:  No Self-injurious Behavior: No Danger to Others: No Duty to Warn:no Physical Aggression / Violence:No  Access to Firearms a concern: No  Gang Involvement:No   Subjective: Beginning the session, patient described himself as doing well stating that he has not been experiencing symptoms. Elaborating, he voiced that he has not felt the need to use strategies discussed. Continuing to talk, patient stated that he wanted to address a memory from a situation that occurred two years ago. Patient spent the session reflecting on the etiology, thoughts, and emotions associated with the memory. He was agreeable to homework and following up. He denied suicidal and homicidal ideation.   Interventions:  Worked on developing a therapeutic relationship with the patient using active listening and reflective statements. Provided emotional support using empathy and validation.Reviewed events since the last session. Praised the patient for doing well and explored what has assisted the patient.  Reflected on talking points for the session. Identified goals. Explored the memory that the patient was experiencing. Used socratic questions to assist the patient gain insight into self and the purpose of the memory. Provided psychoeducation that memories can serve a purpose. Processed whether or not there is a purpose behind this specific memory in which conflict occurred in the small family system. Processed thoughts and emotions related to the memory. Normalized and validated expressed thoughts. Processed ways that patient can get a resolution to questions asked. Assigned homework. Assessed for suicidal and homicidal ideation.   Homework: Reflect on whether or not he wants to approach extended family about their perceptions of why he moved away  Next Session: Review homework and emotional support. Discuss how to explore conversation with extend family if need be.   Diagnosis: F32.0 major depressive affective disorder, single episode, mild  Plan:   Goals Alleviate depressive symptoms Recognize, accept, and cope with depressive feelings Develop healthy thinking patterns Develop healthy interpersonal relationships  Objectives target date for all objectives is 10/25/2022 Cooperate with a medication evaluation by a physician Verbalize an accurate understanding of depression Verbalize an understanding of the treatment Identify and replace thoughts that support depression Learn and implement behavioral strategies Verbalize an understanding and resolution of current interpersonal problems Learn and implement problem solving and decision making skills Learn and implement conflict resolution skills to resolve interpersonal problems Verbalize an understanding of healthy and unhealthy emotions verbalize insight into how past relationships may be influence current experiences with depression Use mindfulness and acceptance strategies and increase value based behavior  Increase hopeful statements  about the future.  Interventions Consistent with treatment model, discuss how change  in cognitive, behavioral, and interpersonal can help client alleviate depression CBT Behavioral activation help the client explore the relationship, nature of the dispute,  Help the client develop new interpersonal skills and relationships Conduct Problem so living therapy Teach conflict resolution skills Use a process-experiential approach Conduct TLDP Conduct ACT Evaluate need for psychotropic medication Monitor adherence to medication   The patient and clinician reviewed the treatment plan on 11/16/2021. The patient approved of the treatment plan.   Hilbert Corrigan, PsyD

## 2021-12-12 ENCOUNTER — Other Ambulatory Visit: Payer: Self-pay | Admitting: Physician Assistant

## 2021-12-17 ENCOUNTER — Ambulatory Visit (INDEPENDENT_AMBULATORY_CARE_PROVIDER_SITE_OTHER): Payer: BC Managed Care – PPO | Admitting: Physician Assistant

## 2021-12-17 ENCOUNTER — Encounter: Payer: Self-pay | Admitting: Physician Assistant

## 2021-12-17 VITALS — BP 160/90 | HR 110 | Temp 98.3°F | Ht 70.0 in | Wt 368.4 lb

## 2021-12-17 DIAGNOSIS — Z23 Encounter for immunization: Secondary | ICD-10-CM

## 2021-12-17 DIAGNOSIS — I10 Essential (primary) hypertension: Secondary | ICD-10-CM

## 2021-12-17 DIAGNOSIS — E8881 Metabolic syndrome: Secondary | ICD-10-CM

## 2021-12-17 DIAGNOSIS — F339 Major depressive disorder, recurrent, unspecified: Secondary | ICD-10-CM

## 2021-12-17 DIAGNOSIS — E88819 Insulin resistance, unspecified: Secondary | ICD-10-CM

## 2021-12-17 LAB — COMPREHENSIVE METABOLIC PANEL
ALT: 24 U/L (ref 0–53)
AST: 21 U/L (ref 0–37)
Albumin: 4.7 g/dL (ref 3.5–5.2)
Alkaline Phosphatase: 77 U/L (ref 39–117)
BUN: 9 mg/dL (ref 6–23)
CO2: 23 mEq/L (ref 19–32)
Calcium: 9.5 mg/dL (ref 8.4–10.5)
Chloride: 102 mEq/L (ref 96–112)
Creatinine, Ser: 0.72 mg/dL (ref 0.40–1.50)
GFR: 124.12 mL/min (ref >60.00)
Glucose, Bld: 161 mg/dL — ABNORMAL HIGH (ref 70–99)
Potassium: 3.3 mEq/L — ABNORMAL LOW (ref 3.5–5.1)
Sodium: 131 mEq/L — ABNORMAL LOW (ref 135–145)
Total Bilirubin: 0.8 mg/dL (ref 0.2–1.2)
Total Protein: 7.9 g/dL (ref 6.0–8.3)

## 2021-12-17 LAB — HEMOGLOBIN A1C: Hgb A1c MFr Bld: 6 % (ref 4.6–6.5)

## 2021-12-17 LAB — LIPID PANEL
Cholesterol: 212 mg/dL — ABNORMAL HIGH (ref 0–200)
HDL: 36.5 mg/dL — ABNORMAL LOW (ref >39.00)
NonHDL: 175.92
Total CHOL/HDL Ratio: 6
Triglycerides: 218 mg/dL — ABNORMAL HIGH (ref 0.0–149.0)
VLDL: 43.6 mg/dL — ABNORMAL HIGH (ref 0.0–40.0)

## 2021-12-17 LAB — LDL CHOLESTEROL, DIRECT: Direct LDL: 153 mg/dL

## 2021-12-17 MED ORDER — LOSARTAN POTASSIUM-HCTZ 100-25 MG PO TABS: 1.0000 | ORAL_TABLET | Freq: Every day | ORAL | 1 refills | Status: DC

## 2021-12-17 MED ORDER — BUPROPION HCL ER (XL) 300 MG PO TB24: 300.0000 mg | ORAL_TABLET | Freq: Every day | ORAL | 1 refills | Status: DC

## 2021-12-17 NOTE — Progress Notes (Signed)
Barry Walker is a 31 y.o. male here for a follow up on anxiety.   History of Present Illness:   Chief Complaint  Patient presents with   Anxiety   Depression    HPI  Depression  3 months follow-up. Patient is currently compliant with taking Wellbutrin XL 150 mg daily with no complications. States he has found the medication to be beneficial for him. Feels like his symptoms well controlled at this time. Has had increased anxiety in the previous visit regarding to his weight and job. Has been participating in talk therapy and thinks this has been working well for her. At this time, he is interested to trial higher dosage of Wellbutrin.  Denies any worsening anxiety or depression sx. Denies SI/HI.  HTN Currently taking Amlodipine 10 mg daily and losartan-HCTZ 100-12.5 mg daily. At home blood pressure readings are: checked often. Patient denies chest pain, SOB, blurred vision, dizziness, unusual headaches, lower leg swelling. Patient is  compliant with medication and denies any adverse side effects. Has as been trying to adjust his lifestyle by making healthier diet choices and getting regular exercise. He is currently walking 4 miles 6 times a week and follows balanced diet. Denies excessive caffeine intake, stimulant usage, excessive alcohol intake, or increase in salt consumption.  BP Readings from Last 3 Encounters:  12/17/21 (!) 150/82  09/18/21 (!) 142/96  09/06/21 (!) 161/98    Insulin Resistance He would like to update his A1c today. Denies any concerning sx.   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 No pertinent surgical history.  Family History  Problem Relation Age of Onset   ADD / ADHD Mother    Anxiety disorder Mother    Depression Mother    Post-traumatic stress disorder Mother    Hypertension Father    Sleep apnea Father    ADD / ADHD Sister    Colon cancer Maternal Grandfather 7   Prostate cancer Neg Hx    Breast cancer Neg Hx    Stroke Neg Hx    Heart attack Neg Hx     Allergies  Allergen Reactions   Penicillins     Reaction = unknown, was told as a child he had a reaction to it.     Current Medications:   Current Outpatient Medications:    acetaminophen (TYLENOL) 500 MG tablet, Take 500 mg by mouth every 6 (six) hours as needed., Disp: , Rfl:    amLODipine (NORVASC) 10 MG tablet, Take 1 tablet (10 mg total) by mouth daily., Disp: 90 tablet, Rfl: 1   buPROPion (WELLBUTRIN XL) 150 MG 24 hr tablet, Take 1 tablet (150 mg total) by mouth daily., Disp: 90 tablet, Rfl: 0   losartan-hydrochlorothiazide (HYZAAR) 100-12.5 MG tablet, TAKE 1 TABLET BY MOUTH EVERY DAY, Disp:  30 tablet, Rfl: 1   Multiple Vitamin (MULTIVITAMIN) tablet, Take 1 tablet by mouth daily., Disp: , Rfl:    Review of Systems:   ROS Negative unless otherwise specified per HPI.   Vitals:   Vitals:   12/17/21 1049  BP: (!) 150/82  Pulse: (!) 123  Temp: 98.3 F (36.8 C)  TempSrc: Temporal  SpO2: 98%  Weight: (!) 368 lb 6.1 oz (167.1 kg)  Height: 5\' 10"  (1.778 m)     Body mass index is 52.86 kg/m.  Physical Exam:   Physical Exam Vitals and nursing note reviewed.  Constitutional:      General: He is not in acute distress.    Appearance: He is well-developed. He is not ill-appearing or toxic-appearing.  Cardiovascular:     Rate and Rhythm: Normal rate and  regular rhythm.     Pulses: Normal pulses.     Heart sounds: Normal heart sounds, S1 normal and S2 normal.  Pulmonary:     Effort: Pulmonary effort is normal.     Breath sounds: Normal breath sounds.  Skin:    General: Skin is warm and dry.  Neurological:     Mental Status: He is alert.     GCS: GCS eye subscore is 4. GCS verbal subscore is 5. GCS motor subscore is 6.  Psychiatric:        Speech: Speech normal.        Behavior: Behavior normal. Behavior is cooperative.     Assessment and Plan:   Depression, recurrent (HCC) Improving; denies SI/HI Increase Wellbutrin to 300 mg XL daily Continue talk therapy Follow-up in 6 months, sooner if concerns I discussed with patient that if they develop any SI, to tell someone immediately and seek medical attention.  Primary hypertension Above goal Continue amlodopine 10 mg daily Increase losartan-hctz to max dose of losartan 100 - hctz 25 Continue to monitor home BP, use CPAP, work on lifestyle changes Follow-up in 6 months, sooner if concerns  Insulin resistance Recheck A1c and provide recommendations accordingly  Need for prophylactic vaccination with combined diphtheria-tetanus-pertussis (DTP) vaccine Updated today  I,Savera Zaman,acting as a scribe for , PA.,have documented all relevant documentation on the behalf of [employer], PA,as directed by  Jarold Motto, PA while in the presence of Jarold Motto, Jarold Motto.   I, [place], Jarold Motto, have reviewed all documentation for this visit. The documentation on 12/17/21 for the exam, diagnosis, procedures, and orders are all accurate and complete.   12/19/21, PA-C

## 2021-12-17 NOTE — Patient Instructions (Signed)
It was great to see you!  Keep up the good work!  We will increase your blood pressure medication to losartan 100 - hctz 25 We will increase your wellbutrin to 300  Let's follow-up in 6 months, sooner if you have concerns.  If a referral was placed today --> you will be contacted for an appointment. Please note that routine referrals can sometimes take up to 3-4 weeks to process. Please call our office if you haven't heard anything after this time frame.  If blood work, urine studies, or any imaging was ordered today -->  we will release your results to you on your MyChart account (if you have chosen to sign up for this) with further instructions. You may see the results before I do, but when I review them I will send you a message with my report or have my staff call you if things need to be discussed. Please reply to my message with any questions.   Take care,  Jarold Motto PA-C

## 2021-12-18 ENCOUNTER — Other Ambulatory Visit: Payer: Self-pay | Admitting: Physician Assistant

## 2021-12-19 ENCOUNTER — Other Ambulatory Visit: Payer: Self-pay | Admitting: Physician Assistant

## 2021-12-19 MED ORDER — METOPROLOL SUCCINATE ER 25 MG PO TB24: 25.0000 mg | ORAL_TABLET | Freq: Every day | ORAL | 1 refills | Status: DC

## 2021-12-19 MED ORDER — LOSARTAN POTASSIUM 100 MG PO TABS: 100.0000 mg | ORAL_TABLET | Freq: Every day | ORAL | 1 refills | Status: DC

## 2022-01-06 ENCOUNTER — Encounter (HOSPITAL_BASED_OUTPATIENT_CLINIC_OR_DEPARTMENT_OTHER): Payer: Self-pay | Admitting: Emergency Medicine

## 2022-01-06 ENCOUNTER — Other Ambulatory Visit: Payer: Self-pay

## 2022-01-06 ENCOUNTER — Emergency Department (HOSPITAL_BASED_OUTPATIENT_CLINIC_OR_DEPARTMENT_OTHER): Payer: BC Managed Care – PPO

## 2022-01-06 ENCOUNTER — Emergency Department (HOSPITAL_BASED_OUTPATIENT_CLINIC_OR_DEPARTMENT_OTHER)
Admission: EM | Admit: 2022-01-06 | Discharge: 2022-01-06 | Disposition: A | Discharge status: Home / Self Care | Payer: BC Managed Care – PPO | Attending: Emergency Medicine | Admitting: Emergency Medicine

## 2022-01-06 DIAGNOSIS — M25511 Pain in right shoulder: Secondary | ICD-10-CM | POA: Diagnosis not present

## 2022-01-06 DIAGNOSIS — R109 Unspecified abdominal pain: Secondary | ICD-10-CM | POA: Insufficient documentation

## 2022-01-06 LAB — URINALYSIS, ROUTINE W REFLEX MICROSCOPIC
Bilirubin Urine: NEGATIVE
Glucose, UA: NEGATIVE mg/dL
Hgb urine dipstick: NEGATIVE
Ketones, ur: NEGATIVE mg/dL
Leukocytes,Ua: NEGATIVE
Nitrite: NEGATIVE
Specific Gravity, Urine: 1.029 (ref 1.005–1.030)
pH: 6 (ref 5.0–8.0)

## 2022-01-06 LAB — COMPREHENSIVE METABOLIC PANEL
ALT: 33 U/L (ref 0–44)
AST: 25 U/L (ref 15–41)
Albumin: 4.9 g/dL (ref 3.5–5.0)
Alkaline Phosphatase: 76 U/L (ref 38–126)
Anion gap: 12 (ref 5–15)
BUN: 10 mg/dL (ref 6–20)
CO2: 24 mmol/L (ref 22–32)
Calcium: 9.9 mg/dL (ref 8.9–10.3)
Chloride: 103 mmol/L (ref 98–111)
Creatinine, Ser: 0.79 mg/dL (ref 0.61–1.24)
GFR, Estimated: >60 mL/min (ref >60)
Glucose, Bld: 92 mg/dL (ref 70–99)
Potassium: 4.2 mmol/L (ref 3.5–5.1)
Sodium: 139 mmol/L (ref 135–145)
Total Bilirubin: 0.9 mg/dL (ref 0.3–1.2)
Total Protein: 8.3 g/dL — ABNORMAL HIGH (ref 6.5–8.1)

## 2022-01-06 LAB — CBC WITH DIFFERENTIAL/PLATELET
Abs Immature Granulocytes: 0.02 10*3/uL (ref 0.00–0.07)
Basophils Absolute: 0 10*3/uL (ref 0.0–0.1)
Basophils Relative: 0 %
Eosinophils Absolute: 0.2 10*3/uL (ref 0.0–0.5)
Eosinophils Relative: 2 %
HCT: 44.9 % (ref 39.0–52.0)
Hemoglobin: 15.5 g/dL (ref 13.0–17.0)
Immature Granulocytes: 0 %
Lymphocytes Relative: 21 %
Lymphs Abs: 2 10*3/uL (ref 0.7–4.0)
MCH: 30.5 pg (ref 26.0–34.0)
MCHC: 34.5 g/dL (ref 30.0–36.0)
MCV: 88.2 fL (ref 80.0–100.0)
Monocytes Absolute: 0.8 10*3/uL (ref 0.1–1.0)
Monocytes Relative: 8 %
Neutro Abs: 6.7 10*3/uL (ref 1.7–7.7)
Neutrophils Relative %: 69 %
Platelets: 318 10*3/uL (ref 150–400)
RBC: 5.09 MIL/uL (ref 4.22–5.81)
RDW: 13.3 % (ref 11.5–15.5)
WBC: 9.7 10*3/uL (ref 4.0–10.5)
nRBC: 0 % (ref 0.0–0.2)

## 2022-01-06 LAB — LIPASE, BLOOD: Lipase: 13 U/L (ref 11–51)

## 2022-01-06 MED ORDER — IOHEXOL 300 MG/ML  SOLN
100.0000 mL | Freq: Once | INTRAMUSCULAR | Status: AC | PRN
  Administered 2022-01-06: 100 mL via INTRAVENOUS

## 2022-01-06 NOTE — ED Provider Notes (Signed)
[facility] EMERGENCY DEPT Provider Note   CSN: [id number] Arrival date & time: 01/06/22  1250     History  Chief Complaint  Patient presents with   Flank Pain    Barry Walker is a 31 y.o. male.  Patient is a 31 yo male with pmh of colitis presenting for right side pain that started this morning upon waking two mornings ago, dull in nature, lasting approx 20-30 min. Patient states pain is improved at this time. Denies hx of renal stones, hematuria, or difficulty urinating. Denies fevers, chills, nausea, vomiting, diarrhea, or constipation. Denies rectal bleeding or blood in stools. Pt also reports right sided shoulder blade that intermittent, described as mild pain, lasting approx a second, then spontaneously resolved, x 2 episodes in the last week.   The history is provided by the patient. No language interpreter was used.  Flank Pain Associated symptoms include abdominal pain. Pertinent negatives include no chest pain and no shortness of breath.       Home Medications Prior to Admission medications   Medication Sig Start Date End Date Taking? Authorizing Provider  acetaminophen (TYLENOL) 500 MG tablet Take 500 mg by mouth every 6 (six) hours as needed.    [provider]  amLODipine (NORVASC) 10 MG tablet Take 1 tablet (10 mg total) by mouth daily. 06/18/21   Inda Coke, PA  buPROPion (WELLBUTRIN XL) 300 MG 24 hr tablet Take 1 tablet (300 mg total) by mouth daily. 12/17/21   Inda Coke, PA  losartan (COZAAR) 100 MG tablet Take 1 tablet (100 mg total) by mouth daily. 12/19/21   Inda Coke, PA  metoprolol succinate (TOPROL-XL) 25 MG 24 hr tablet Take 1 tablet (25 mg total) by mouth daily. 12/19/21   Inda Coke, PA  Multiple Vitamin (MULTIVITAMIN) tablet Take 1 tablet by mouth daily.    [provider]      Allergies    Penicillins    Review of Systems   Review of Systems  Constitutional:  Negative for chills and fever.   HENT:  Negative for ear pain and sore throat.   Eyes:  Negative for pain and visual disturbance.  Respiratory:  Negative for cough and shortness of breath.   Cardiovascular:  Negative for chest pain and palpitations.  Gastrointestinal:  Positive for abdominal pain. Negative for vomiting.  Genitourinary:  Negative for dysuria, flank pain and hematuria.  Musculoskeletal:  Negative for arthralgias and back pain.  Skin:  Negative for color change and rash.  Neurological:  Negative for seizures and syncope.  All other systems reviewed and are negative.   Physical Exam Updated Vital Signs BP 122/78   Pulse 85   Temp 98 F (36.7 C)   Resp 20   SpO2 95%  Physical Exam Vitals and nursing note reviewed.  Constitutional:      General: He is not in acute distress.    Appearance: He is well-developed.  HENT:     Head: Normocephalic and atraumatic.  Eyes:     Conjunctiva/sclera: Conjunctivae normal.  Cardiovascular:     Rate and Rhythm: Normal rate and regular rhythm.     Heart sounds: No murmur heard. Pulmonary:     Effort: Pulmonary effort is normal. No respiratory distress.     Breath sounds: Normal breath sounds.  Abdominal:     Palpations: Abdomen is soft.     Tenderness: There is no abdominal tenderness.  Musculoskeletal:        General: No swelling.  Cervical back: Neck supple.  Skin:    General: Skin is warm and dry.     Capillary Refill: Capillary refill takes less than 2 seconds.  Neurological:     Mental Status: He is alert.  Psychiatric:        Mood and Affect: Mood normal.     ED Results / Procedures / Treatments   Labs (all labs ordered are listed, but only abnormal results are displayed) Labs Reviewed  COMPREHENSIVE METABOLIC PANEL - Abnormal; Notable for the following components:      Result Value   Total Protein 8.3 (*)    All other components within normal limits  URINALYSIS, ROUTINE W REFLEX MICROSCOPIC - Abnormal; Notable for the following  components:   Protein, ur TRACE (*)    All other components within normal limits  CBC WITH DIFFERENTIAL/PLATELET  LIPASE, BLOOD    EKG EKG Interpretation  Date/Time:  Sunday January 06 2022 15:53:40 EDT Ventricular Rate:  76 PR Interval:  155 QRS Duration: 103 QT Interval:  386 QTC Calculation: 434 R Axis:   29 Text Interpretation: Sinus rhythm Low voltage, precordial leads Confirmed by Edwin Dada ([id number]) on 01/06/2022 5:28:02 PM  Radiology CT ABDOMEN PELVIS W CONTRAST  Result Date: 01/06/2022 CLINICAL DATA:  Right lower quadrant pain EXAM: CT ABDOMEN AND PELVIS WITH CONTRAST TECHNIQUE: Multidetector CT imaging of the abdomen and pelvis was performed using the standard protocol following bolus administration of intravenous contrast. RADIATION DOSE REDUCTION: This exam was performed according to the departmental dose-optimization program which includes automated exposure control, adjustment of the mA and/or kV according to patient size and/or use of iterative reconstruction technique. CONTRAST:  OMNIPAQUE IOHEXOL 300 MG/ML  SOLN COMPARISON:  None Available. FINDINGS: Lower chest: No acute abnormality Hepatobiliary: Diffuse low-density throughout the liver compatible with fatty infiltration. No focal abnormality. Gallbladder unremarkable. Pancreas: No focal abnormality or ductal dilatation. Spleen: No focal abnormality.  Normal size. Adrenals/Urinary Tract: No adrenal abnormality. No focal renal abnormality. No stones or hydronephrosis. Urinary bladder is unremarkable. Stomach/Bowel: Normal appendix. Stomach, large and small bowel grossly unremarkable. Vascular/Lymphatic: No evidence of aneurysm or adenopathy. Reproductive: No visible focal abnormality. Other: Moderate-sized umbilical hernia containing fat. No free fluid or free air. Musculoskeletal: No acute bony abnormality. IMPRESSION: Normal appendix. Fatty liver. Moderate-sized umbilical hernia containing fat. No acute findings.  Electronically Signed   By: Charlett Nose M.D.   On: 01/06/2022 17:10    Procedures Procedures    Medications Ordered in ED Medications  iohexol (OMNIPAQUE) 300 MG/ML solution 100 mL (100 mLs Intravenous Contrast Given 01/06/22 1657)    ED Course/ Medical Decision Making/ A&P                           Medical Decision Making Amount and/or Complexity of Data Reviewed Radiology: ordered. ECG/medicine tests: ordered.  Risk Prescription drug management.   5:32 PM 31 yo male with pmh of colitis presenting for right side pain that started this morning upon waking two mornings ago, dull in nature, lasting approx 20-30 min.   The patient's overall condition has improved, the patient presents with abdominal pain without signs of peritonitis, or other life-threatening serious etiology.  Scan demonstrates no bowel obstruction, no diverticulitis, no appendicitis free air, no free fluid.  Laboratory studies are stable including liver profile, lipase, and renal function.  The patient understands that at this time there is no evidence for a more malignant underlying process, but the patient  also understands that early in the process of an illness, an emergency department workup can be falsely reassuring. Detailed discussions were had with the patient regarding current findings, and need for close f/u with PCP or on call doctor. The patient appears stable for discharge and has been instructed to return immediately if the symptoms worsen in any way, or in 8-12hr if not improved for re-evaluation. Patient verbalized understanding and is in agreement with current care plan.  All questions answered prior to discharge.          Final Clinical Impression(s) / ED Diagnoses Final diagnoses:  Abdominal pain/right side pain    Rx / DC Orders ED Discharge Orders     None         Franne Forts, DO 01/06/22 1732

## 2022-01-06 NOTE — ED Triage Notes (Signed)
Past 2 mornings pt has awoken with right flank discomfort , that resolved itself. Pt states he has had this before and was dx colitis, told to come be seen if ever happens again.

## 2022-01-09 ENCOUNTER — Ambulatory Visit (INDEPENDENT_AMBULATORY_CARE_PROVIDER_SITE_OTHER): Payer: BC Managed Care – PPO | Admitting: Psychologist

## 2022-01-09 DIAGNOSIS — F320 Major depressive disorder, single episode, mild: Secondary | ICD-10-CM

## 2022-01-09 NOTE — Progress Notes (Signed)
[facility] Counselor/Therapist Progress Note  Patient ID: Barry Walker, MRN: [id number],    Date: 01/09/2022  Time Spent: 09:05 am to 09:45 am; total time: 40 minutes   This session was held via in person. The patient consented to in-person therapy and was in the clinician's office. Limits of confidentiality were discussed with the patient.   Treatment Type: Individual Therapy  Reported Symptoms: Less depressive symptoms  Mental Status Exam: Appearance:  Well Groomed     Behavior: Appropriate  Motor: Normal  Speech/Language:  Clear and Coherent  Affect: Appropriate  Mood: normal  Thought process: normal  Thought content:   WNL  Sensory/Perceptual disturbances:   WNL  Orientation: oriented to person, place, and time/date  Attention: Good  Concentration: Good  Memory: WNL  Fund of knowledge:  Good  Insight:   Fair  Judgment:  Fair  Impulse Control: Good   Risk Assessment: Danger to Self:  No Self-injurious Behavior: No Danger to Others: No Duty to Warn:no Physical Aggression / Violence:No  Access to Firearms a concern: No  Gang Involvement:No   Subjective: Beginning the session, patient described himself as doing well while reflecting on events since the last session. Elaborating, he stated that he had visited his family, which he enjoyed. From there, he spent the session reflecting on an internal dilemma he is experiencing related to looking for a new job. Through the conversation, identifying what brings fulfillment and passion to the patient was discussed. He was agreeable to homework and following up. He denied suicidal and homicidal ideation.  Interventions:  Worked on developing a therapeutic relationship with the patient using active listening and reflective statements. Provided emotional support using empathy and validation.Reviewed events since the last session. Used summary and reflective statements. Processed the steps that patient has taken to  improve concerns he was experiencing. Identified goals for the session. Explored concerns looking for a new job. Explored patient's values and passions. Challenged some of the thoughts expressed by the patient. Provided feedback to the patient based off of the perspective of a recruiter. Challenged the patient's mindset regarding looking for a new job. Normalized and validated expressed thoughts and emotions. Attempted to explored values. Began to do a decisional analysis with the patient. Assigned homework. Assessed for suicidal and homicidal ideation.   Homework: Reflect on what brings value and passion to the patient regarding looking at different career opportunities  Next Session: Review homework and emotional support.  Diagnosis: F32.0 major depressive affective disorder, single episode, mild  Plan:   Goals Alleviate depressive symptoms Recognize, accept, and cope with depressive feelings Develop healthy thinking patterns Develop healthy interpersonal relationships  Objectives target date for all objectives is 10/25/2022 Cooperate with a medication evaluation by a physician Verbalize an accurate understanding of depression Verbalize an understanding of the treatment Identify and replace thoughts that support depression Learn and implement behavioral strategies Verbalize an understanding and resolution of current interpersonal problems Learn and implement problem solving and decision making skills Learn and implement conflict resolution skills to resolve interpersonal problems Verbalize an understanding of healthy and unhealthy emotions verbalize insight into how past relationships may be influence current experiences with depression Use mindfulness and acceptance strategies and increase value based behavior  Increase hopeful statements about the future.  Interventions Consistent with treatment model, discuss how change in cognitive, behavioral, and interpersonal can help client  alleviate depression CBT Behavioral activation help the client explore the relationship, nature of the dispute,  Help the client develop new interpersonal skills  and relationships Conduct Problem so living therapy Teach conflict resolution skills Use a process-experiential approach Conduct TLDP Conduct ACT Evaluate need for psychotropic medication Monitor adherence to medication   The patient and clinician reviewed the treatment plan on 11/16/2021. The patient approved of the treatment plan.   Hilbert Corrigan, PsyD

## 2022-01-22 NOTE — Progress Notes (Unsigned)
[facility] [address] [place]. [place] [address] [phone]       OFFICE FOLLOW UP NOTE  Mr. Chadwick Reiswig Date of Birth:  30-Jan-1991 Medical Record Number:  [id number]   Reason for visit: Initial CPAP follow-up    SUBJECTIVE:   CHIEF COMPLAINT:  No chief complaint on file.   HPI:   Update 01/23/2022 JM: Patient returns for initial CPAP compliance visit.  Completed HST 6/7 which showed severe OSA with total AHI of 79.5/h and O2 nadir 72% with significant time below are at 88% saturation for over 30 minutes of the night indicating nocturnal hypoxia.  Recommend initiating AutoPap        Consult visit 09/06/2021 Dr. Frances Furbish: Mr. Musgrave is a 31 year old right-handed gentleman with an underlying medical history of hypertension, reflux disease, hyperlipidemia, and morbid obesity with a BMI of over 50, who reports snoring and excessive daytime somnolence as well as witnessed apneas per family and friends.  He shared a hotel room last year in the summer when he went to a wedding and was told that he stops breathing while asleep.  His father has sleep apnea and has a machine.  Patient endorses daytime somnolence and decreased daytime energy.  He has had weight fluctuation.  When he was exercising on a regular basis, he was able to lose weight.  He sprained his left ankle and had some difficulty exercising.  He has had blood pressure fluctuation but at home his blood pressure numbers have been better.  He does not drink caffeine daily.  He is single and lives alone, no pets in the house.  He does have a TV in his bedroom and puts it on a 60-minute timer.  He works second shift essentially.  He works Mondays through Fridays from 2:30 PM to midnight.  He has a 15 to 20-minute commute.  He is typically in bed between 2:15 AM and 3 AM on most nights and rise time is around 11 AM.  He has nocturia once per average night and denies any recurrent morning headaches.  He drinks  alcohol occasionally on the weekends.  He is a non-smoker.  He thought he had a tonsillectomy as a child but is not 100% sure.  I reviewed your office note from 06/18/2021.  His Epworth sleepiness score is 11 out of 24, fatigue severity score is 43 out of 63.       ROS:   14 system review of systems performed and negative with exception of ***  PMH:  Past Medical History:  Diagnosis Date   Allergy 1992/12/16   Penicilin   GERD (gastroesophageal reflux disease) 12/15/2020   HLD (hyperlipidemia) 08/22/2020   Hypertension 08/22/2020   Rectus diastasis 12/15/2020    PSH: No past surgical history on file.  Social History:  Social History   Socioeconomic History   Marital status: Single    Spouse name: Not on file   Number of children: Not on file   Years of education: Not on file   Highest education level: Not on file  Occupational History   Not on file  Tobacco Use   Smoking status: Never   Smokeless tobacco: Never   Tobacco comments:    A cigar on special occasions, but no cigs/vape  Vaping Use   Vaping Use: Never used  Substance and Sexual Activity   Alcohol use: Yes    Alcohol/week: 3.0 standard drinks of alcohol    Types: 3 Standard drinks or equivalent per week  Comment: Only drink on Saturday during a normal week   Drug use: Never   Sexual activity: Not Currently    Birth control/protection: Condom  Other Topics Concern   Not on file  Social History Narrative   Dietitian for [employer]   Grew up in [place]   Has been in [place] since sept 2018.     Caffeine water, sugar free lemonade.    Social Determinants of Health   Financial Resource Strain: Not on file  Food Insecurity: Not on file  Transportation Needs: Not on file  Physical Activity: Not on file  Stress: Not on file  Social Connections: Not on file  Intimate Partner Violence: Not on file    Family History:  Family History  Problem Relation Age of Onset   ADD / ADHD Mother    Anxiety disorder  Mother    Depression Mother    Post-traumatic stress disorder Mother    Hypertension Father    Sleep apnea Father    ADD / ADHD Sister    Colon cancer Maternal Grandfather 19   Prostate cancer Neg Hx    Breast cancer Neg Hx    Stroke Neg Hx    Heart attack Neg Hx     Medications:   Current Outpatient Medications on File Prior to Visit  Medication Sig Dispense Refill   acetaminophen (TYLENOL) 500 MG tablet Take 500 mg by mouth every 6 (six) hours as needed.     amLODipine (NORVASC) 10 MG tablet Take 1 tablet (10 mg total) by mouth daily. 90 tablet 1   buPROPion (WELLBUTRIN XL) 300 MG 24 hr tablet Take 1 tablet (300 mg total) by mouth daily. 90 tablet 1   losartan (COZAAR) 100 MG tablet Take 1 tablet (100 mg total) by mouth daily. 90 tablet 1   metoprolol succinate (TOPROL-XL) 25 MG 24 hr tablet Take 1 tablet (25 mg total) by mouth daily. 90 tablet 1   Multiple Vitamin (MULTIVITAMIN) tablet Take 1 tablet by mouth daily.     No current facility-administered medications on file prior to visit.    Allergies:   Allergies  Allergen Reactions   Penicillins     Reaction = unknown, was told as a child he had a reaction to it.       OBJECTIVE:  Physical Exam  There were no vitals filed for this visit. There is no height or weight on file to calculate BMI. No results found.  General: well developed, well nourished, seated, in no evident distress Head: head normocephalic and atraumatic.   Neck: supple with no carotid or supraclavicular bruits Cardiovascular: regular rate and rhythm, no murmurs Musculoskeletal: no deformity Skin:  no rash/petichiae Vascular:  Normal pulses all extremities   Neurologic Exam Mental Status: Awake and fully alert. Oriented to place and time. Recent and remote memory intact. Attention span, concentration and fund of knowledge appropriate. Mood and affect appropriate.  Cranial Nerves: Pupils equal, briskly reactive to light. Extraocular movements  full without nystagmus. Visual fields full to confrontation. Hearing intact. Facial sensation intact. Face, tongue, palate moves normally and symmetrically.  Motor: Normal bulk and tone. Normal strength in all tested extremity muscles Sensory.: intact to touch , pinprick , position and vibratory sensation.  Coordination: Rapid alternating movements normal in all extremities. Finger-to-nose and heel-to-shin performed accurately bilaterally. Gait and Station: Arises from chair without difficulty. Stance is normal. Gait demonstrates normal stride length and balance without use of AD. Tandem walk and heel toe without  difficulty.  Reflexes: 1+ and symmetric. Toes downgoing.         ASSESSMENT: Aarish Rockers is a 31 y.o. year old male with recent diagnosis of severe OSA and nocturnal hypoxemia on 11/07/2021 and initiated AutoPap 11/23/2021.      PLAN:  OSA on CPAP : Compliance report shows satisfactory usage with optimal residual AHI.  Discussed increasing nightly usage with ensuring greater than 4 hours nightly for optimal benefit and per insurance purposes.  Recommend obtaining ONO to ensure nocturnal hypoxemia resolved with CPAP treatment ***. Continue to follow with DME company for any needed supplies or CPAP related concerns     Follow up in *** or call earlier if needed   CC:  PCP: Jarold Motto, PA    I spent *** minutes of face-to-face and non-face-to-face time with patient.  This included previsit chart review, lab review, study review, order entry, electronic health record documentation, patient education regarding diagnosis of sleep apnea with review and discussion of compliance report and answered all other questions to patient's satisfaction   Ihor Austin, [facility]  [facility] [address] [place], [place] [address]  Phone [phone] Fax [phone] Note: This document was prepared with digital dictation and possible smart phrase  technology. Any transcriptional errors that result from this process are unintentional.

## 2022-01-23 ENCOUNTER — Encounter: Payer: Self-pay | Admitting: Adult Health

## 2022-01-23 ENCOUNTER — Ambulatory Visit (INDEPENDENT_AMBULATORY_CARE_PROVIDER_SITE_OTHER): Payer: BC Managed Care – PPO | Admitting: Adult Health

## 2022-01-23 VITALS — BP 154/105 | HR 82 | Ht 70.0 in | Wt 362.0 lb

## 2022-01-23 DIAGNOSIS — G4734 Idiopathic sleep related nonobstructive alveolar hypoventilation: Secondary | ICD-10-CM

## 2022-01-23 DIAGNOSIS — Z9989 Dependence on other enabling machines and devices: Secondary | ICD-10-CM | POA: Diagnosis not present

## 2022-01-23 DIAGNOSIS — G4730 Sleep apnea, unspecified: Secondary | ICD-10-CM

## 2022-02-15 ENCOUNTER — Ambulatory Visit: Payer: BC Managed Care – PPO | Admitting: Psychologist

## 2022-02-15 DIAGNOSIS — F320 Major depressive disorder, single episode, mild: Secondary | ICD-10-CM

## 2022-02-15 NOTE — Progress Notes (Signed)
[facility] Counselor/Therapist Progress Note  Patient ID: Barry Walker, MRN: [id number],    Date: 02/15/2022  Time Spent: 09:03 am to 09:19 am; total time: 16 minutes   This session was held via in person. The patient consented to in-person therapy and was in the clinician's office. Limits of confidentiality were discussed with the patient.   Treatment Type: Individual Therapy  Reported Symptoms: Denied symptoms  Mental Status Exam: Appearance:  Well Groomed     Behavior: Appropriate  Motor: Normal  Speech/Language:  Clear and Coherent  Affect: Appropriate  Mood: normal  Thought process: normal  Thought content:   WNL  Sensory/Perceptual disturbances:   WNL  Orientation: oriented to person, place, and time/date  Attention: Good  Concentration: Good  Memory: WNL  Fund of knowledge:  Good  Insight:   Fair  Judgment:  Fair  Impulse Control: Good   Risk Assessment: Danger to Self:  No Self-injurious Behavior: No Danger to Others: No Duty to Warn:no Physical Aggression / Violence:No  Access to Firearms a concern: No  Gang Involvement:No   Subjective: Beginning the session, patient described himself as doing well stating that he no longer needs therapy. Elaborating, he talked about what assisted him and what he learned through counseling. He processed potential barriers and how to overcome them. He talked about how the therapeutic relationship assisted him. He denied suicidal and homicidal ideation.   Interventions:  Worked on developing a therapeutic relationship with the patient using active listening and reflective statements. Provided emotional support using empathy and validation.Reviewed events since the last session. Reviewed events since the last session. Praised the patient for doing well and explored what has assisted the patient. Explored whether or not patient was done with counseling. Processed what patient learned about self in counseling process.  Identified potential barriers and how to overcome those barriers. Reflected on how the therapeutic relationship assisted him.  Assessed for suicidal and homicidal ideation.   Homework: NA  Next Session: NA. Patient terminated counseling   Diagnosis: F32.0 major depressive affective disorder, single episode, mild  Plan:   Goals Alleviate depressive symptoms Recognize, accept, and cope with depressive feelings Develop healthy thinking patterns Develop healthy interpersonal relationships  Objectives target date for all objectives is 10/25/2022 Cooperate with a medication evaluation by a physician Verbalize an accurate understanding of depression Verbalize an understanding of the treatment Identify and replace thoughts that support depression Learn and implement behavioral strategies Verbalize an understanding and resolution of current interpersonal problems Learn and implement problem solving and decision making skills Learn and implement conflict resolution skills to resolve interpersonal problems Verbalize an understanding of healthy and unhealthy emotions verbalize insight into how past relationships may be influence current experiences with depression Use mindfulness and acceptance strategies and increase value based behavior  Increase hopeful statements about the future.  Interventions Consistent with treatment model, discuss how change in cognitive, behavioral, and interpersonal can help client alleviate depression CBT Behavioral activation help the client explore the relationship, nature of the dispute,  Help the client develop new interpersonal skills and relationships Conduct Problem so living therapy Teach conflict resolution skills Use a process-experiential approach Conduct TLDP Conduct ACT Evaluate need for psychotropic medication Monitor adherence to medication   The patient and clinician reviewed the treatment plan on 11/16/2021. The patient approved of the  treatment plan.   Hilbert Corrigan, PsyD

## 2022-02-18 ENCOUNTER — Encounter: Payer: Self-pay | Admitting: Physician Assistant

## 2022-02-18 MED ORDER — AMLODIPINE BESYLATE 10 MG PO TABS: 10.0000 mg | ORAL_TABLET | Freq: Every day | ORAL | 1 refills | Status: DC

## 2022-02-25 ENCOUNTER — Encounter: Payer: Self-pay | Admitting: *Deleted

## 2022-03-11 ENCOUNTER — Encounter: Payer: Self-pay | Admitting: Physician Assistant

## 2022-03-11 MED ORDER — METOPROLOL SUCCINATE ER 25 MG PO TB24: 25.0000 mg | ORAL_TABLET | Freq: Every day | ORAL | 1 refills | Status: DC

## 2022-03-11 MED ORDER — LOSARTAN POTASSIUM 100 MG PO TABS: 100.0000 mg | ORAL_TABLET | Freq: Every day | ORAL | 1 refills | Status: DC

## 2022-03-11 MED ORDER — BUPROPION HCL ER (XL) 300 MG PO TB24: 300.0000 mg | ORAL_TABLET | Freq: Every day | ORAL | 1 refills | Status: DC

## 2022-03-20 ENCOUNTER — Encounter: Payer: Self-pay | Admitting: Physician Assistant

## 2022-03-20 ENCOUNTER — Ambulatory Visit: Payer: BC Managed Care – PPO | Admitting: Physician Assistant

## 2022-03-20 VITALS — BP 146/98 | HR 79 | Temp 97.3°F | Ht 70.0 in | Wt 364.5 lb

## 2022-03-20 DIAGNOSIS — I10 Essential (primary) hypertension: Secondary | ICD-10-CM

## 2022-03-20 DIAGNOSIS — G4733 Obstructive sleep apnea (adult) (pediatric): Secondary | ICD-10-CM | POA: Diagnosis not present

## 2022-03-20 DIAGNOSIS — F339 Major depressive disorder, recurrent, unspecified: Secondary | ICD-10-CM | POA: Diagnosis not present

## 2022-03-20 MED ORDER — CHLORTHALIDONE 25 MG PO TABS: 25.0000 mg | ORAL_TABLET | Freq: Every day | ORAL | 1 refills | Status: DC

## 2022-03-20 NOTE — Patient Instructions (Signed)
It was great to see you!  Start chlorthalidone 25 mg daily Continue losartan 100 mg, amlodopine 10 mg, and metoprolol 25 mg daily  Follow-up for blood work only in 1-2 weeks  Send me a message in 1 month with average blood pressure readings and we will go from there!  Take care,  Inda Coke PA-C

## 2022-03-20 NOTE — Progress Notes (Signed)
Barry Walker is a 31 y.o. male here for a follow up of a pre-existing problem.  History of Present Illness:   Chief Complaint  Patient presents with   Hypertension    Pt has been checking blood pressure at home 4-5 times a week, yesterday was 141/88. Highest 150/90, lowest 129/82.   Depression    Pt says he is doing well, tolerating Wellbutrin.    HPI  Primary Hypertension He was previously on HCTZ which was discontinued due to hypokalemia. K of 3.3 on 01/06/22. He reports that he is eating two bananas daily. He is currently taking losartan 100 mg, amlodipine 10 mg, and metoprolol 25 mg daily.  Denies: chest pain, SOB, LE swelling  BP Readings from Last 3 Encounters:  03/20/22 (!) 144/100  01/23/22 (!) 154/105  01/06/22 115/70   Depression  Patient is currently compliant with taking Wellbutrin XL 300 mg daily with no complications. States he has found the medication to be beneficial for him. Feels like his symptoms well controlled at this time. Has had increased anxiety in the previous visit regarding to his weight and job. He is still looking for employment. He has had trouble getting back into a gym routine, but feels that this is due to a lack of motivation not his depression. Has been participating in talk therapy and thinks this has been working well for him. His therapist was able to discontinue there sessions as patient was doing well. Denies any worsening anxiety or depression sx. Denies SI/HI.  Obstructive Sleep Apnea Compliant with CPAP. Notices significant improvement with this. Started using CPAP in June 2023.  Past Medical History:  Diagnosis Date   Allergy 1993/01/19   Penicilin   GERD (gastroesophageal reflux disease) 12/15/2020   HLD (hyperlipidemia) 08/22/2020   Hypertension 08/22/2020   Rectus diastasis 12/15/2020   Sleep apnea      Social History   Tobacco Use   Smoking status: Never   Smokeless tobacco: Never   Tobacco comments:    A cigar on  special occasions, but no cigs/vape  Vaping Use   Vaping Use: Never used  Substance Use Topics   Alcohol use: Yes    Alcohol/week: 3.0 standard drinks of alcohol    Types: 3 Standard drinks or equivalent per week    Comment: Only drink on Saturday during a normal week   Drug use: Never    History reviewed. No pertinent surgical history.  Family History  Problem Relation Age of Onset   ADD / ADHD Mother    Anxiety disorder Mother    Depression Mother    Post-traumatic stress disorder Mother    Hypertension Father    Sleep apnea Father    ADD / ADHD Sister    Colon cancer Maternal Grandfather 73   Prostate cancer Neg Hx    Breast cancer Neg Hx    Stroke Neg Hx    Heart attack Neg Hx     Allergies  Allergen Reactions   Penicillins     Reaction = unknown, was told as a child he had a reaction to it.     Current Medications:   Current Outpatient Medications:    acetaminophen (TYLENOL) 500 MG tablet, Take 500 mg by mouth every 6 (six) hours as needed., Disp: , Rfl:    amLODipine (NORVASC) 10 MG tablet, Take 1 tablet (10 mg total) by mouth daily., Disp: 90 tablet, Rfl: 1   buPROPion (WELLBUTRIN XL) 300 MG 24 hr tablet, Take 1 tablet (300  mg total) by mouth daily., Disp: 90 tablet, Rfl: 1   chlorthalidone (HYGROTON) 25 MG tablet, Take 1 tablet (25 mg total) by mouth daily., Disp: 30 tablet, Rfl: 1   losartan (COZAAR) 100 MG tablet, Take 1 tablet (100 mg total) by mouth daily., Disp: 90 tablet, Rfl: 1   metoprolol succinate (TOPROL-XL) 25 MG 24 hr tablet, Take 1 tablet (25 mg total) by mouth daily., Disp: 90 tablet, Rfl: 1   Multiple Vitamin (MULTIVITAMIN) tablet, Take 1 tablet by mouth daily., Disp: , Rfl:    Review of Systems:   Review of Systems  Constitutional:  Negative for chills, fever, malaise/fatigue and weight loss.  HENT:  Negative for hearing loss, sinus pain and sore throat.   Respiratory:  Negative for cough and hemoptysis.   Cardiovascular:  Negative for  chest pain, palpitations, leg swelling and PND.  Gastrointestinal:  Negative for abdominal pain, constipation, diarrhea, heartburn, nausea and vomiting.  Genitourinary:  Negative for dysuria, frequency and urgency.  Musculoskeletal:  Negative for back pain, myalgias and neck pain.  Skin:  Negative for itching and rash.  Neurological:  Negative for dizziness, tingling, seizures and headaches.  Endo/Heme/Allergies:  Negative for polydipsia.  Psychiatric/Behavioral:  Negative for depression. The patient is not nervous/anxious.     Vitals:   Vitals:   03/20/22 0802  BP: (!) 144/100  Pulse: 84  Temp: (!) 97.3 F (36.3 C)  TempSrc: Temporal  SpO2: 97%  Weight: (!) 364 lb 8 oz (165.3 kg)  Height: 5\' 10"  (1.778 m)     Body mass index is 52.3 kg/m.  Physical Exam:   Physical Exam Vitals and nursing note reviewed.  Constitutional:      General: He is not in acute distress.    Appearance: He is well-developed. He is not ill-appearing or toxic-appearing.  Cardiovascular:     Rate and Rhythm: Normal rate and regular rhythm.     Pulses: Normal pulses.     Heart sounds: Normal heart sounds, S1 normal and S2 normal.  Pulmonary:     Effort: Pulmonary effort is normal.     Breath sounds: Normal breath sounds.  Skin:    General: Skin is warm and dry.  Neurological:     Mental Status: He is alert.     GCS: GCS eye subscore is 4. GCS verbal subscore is 5. GCS motor subscore is 6.  Psychiatric:        Speech: Speech normal.        Behavior: Behavior normal. Behavior is cooperative.     Assessment and Plan:   Primary hypertension Above goal No evidence of end organ damage Start chlorthalidone 25 mg daily Continue losartan 100 mg, amlodopine 10 mg, and metoprolol 25 mg daily Follow-up in 1-2 weeks for blood work only  OSA (obstructive sleep apnea) Well controlled  Depression, recurrent (HCC) Continue Wellbutrin 300 mg XL daily  Follow-up in 3-6 months  I,Alexis  Herring,acting as a scribe for , PA.,have documented all relevant documentation on the behalf of [employer], PA,as directed by  Jarold Motto, PA while in the presence of Jarold Motto, Jarold Motto.  I, [place], Jarold Motto, have reviewed all documentation for this visit. The documentation on 03/20/22 for the exam, diagnosis, procedures, and orders are all accurate and complete.

## 2022-04-03 ENCOUNTER — Other Ambulatory Visit (INDEPENDENT_AMBULATORY_CARE_PROVIDER_SITE_OTHER): Payer: BC Managed Care – PPO

## 2022-04-03 DIAGNOSIS — I10 Essential (primary) hypertension: Secondary | ICD-10-CM

## 2022-04-03 LAB — BASIC METABOLIC PANEL
BUN: 14 mg/dL (ref 6–23)
CO2: 32 mEq/L (ref 19–32)
Calcium: 9.9 mg/dL (ref 8.4–10.5)
Chloride: 97 mEq/L (ref 96–112)
Creatinine, Ser: 0.79 mg/dL (ref 0.40–1.50)
GFR: 120.44 mL/min (ref >60.00)
Glucose, Bld: 139 mg/dL — ABNORMAL HIGH (ref 70–99)
Potassium: 3.5 mEq/L (ref 3.5–5.1)
Sodium: 138 mEq/L (ref 135–145)

## 2022-04-08 ENCOUNTER — Encounter: Payer: Self-pay | Admitting: Physician Assistant

## 2022-04-11 ENCOUNTER — Other Ambulatory Visit: Payer: Self-pay | Admitting: Physician Assistant

## 2022-04-14 ENCOUNTER — Encounter: Payer: Self-pay | Admitting: Physician Assistant

## 2022-04-15 ENCOUNTER — Other Ambulatory Visit: Payer: Self-pay | Admitting: Physician Assistant

## 2022-04-15 MED ORDER — CHLORTHALIDONE 25 MG PO TABS: 25.0000 mg | ORAL_TABLET | Freq: Every day | ORAL | 1 refills | Status: DC

## 2022-06-12 ENCOUNTER — Other Ambulatory Visit: Payer: Self-pay | Admitting: Physician Assistant

## 2022-06-18 ENCOUNTER — Ambulatory Visit: Payer: BC Managed Care – PPO | Admitting: Physician Assistant

## 2022-07-25 NOTE — Progress Notes (Signed)
[facility] [address] [place]. [place] [address] [phone]       OFFICE FOLLOW UP NOTE  [name] Date of Birth:  [birth year] Medical Record Number:  [id number]    Primary neurologist: Dr. Rexene Alberts Reason for visit: CPAP follow-up    SUBJECTIVE:   CHIEF COMPLAINT:  Chief Complaint  Patient presents with   Follow-up    Patient in room #3 and alone. Patient is well and stable.    Brief HPI:   Barry Walker is a 32 y.o. male who is being followed for OSA on CPAP. Completed HST 6/7 which showed severe OSA with total AHI of 79.5/h and O2 nadir 72% with significant time below are at 88% saturation for over 30 minutes of the night indicating nocturnal hypoxia.  Recommend initiating AutoPap.   At prior initial CPAP visit, doing well on CPAP and no concerns.  Compliance report showed satisfactory usage with optimal residual AHI.  Interval history: Returns for 36-monthCPAP follow-up.  Compliance report shows satisfactory usage and optimal residual AHI.  Continues to tolerate CPAP well has no questions or concerns today.   Does note elevated blood pressure today, routinely monitors at home and typically stable, asymptomatic today, woke up shortly before today's visit and just took HTN medications. He was advised to monitor later today at home and if remains elevated, to contact PCP for further recommendations.             ROS:   14 system review of systems performed and negative with exception of those listed in HPI  PMH:  Past Medical History:  Diagnosis Date   Allergy 111-02-94  Penicilin   GERD (gastroesophageal reflux disease) 12/15/2020   HLD (hyperlipidemia) 08/22/2020   Hypertension 08/22/2020   Rectus diastasis 12/15/2020   Sleep apnea     PSH: History reviewed. No pertinent surgical history.  Social History:  Social History   Socioeconomic History   Marital status: Single    Spouse name: Not on file   Number of  children: Not on file   Years of education: Not on file   Highest education level: Not on file  Occupational History   Not on file  Tobacco Use   Smoking status: Never   Smokeless tobacco: Never   Tobacco comments:    A cigar on special occasions, but no cigs/vape  Vaping Use   Vaping Use: Never used  Substance and Sexual Activity   Alcohol use: Yes    Alcohol/week: 3.0 standard drinks of alcohol    Types: 3 Standard drinks or equivalent per week    Comment: Only drink on Saturday during a normal week   Drug use: Never   Sexual activity: Not Currently    Birth control/protection: Condom  Other Topics Concern   Not on file  Social History Narrative   [employer]   [place]     Caffeine water, sugar free lemonade.    Social Determinants of Health   Financial Resource Strain: Not on file  Food Insecurity: Not on file  Transportation Needs: Not on file  Physical Activity: Not on file  Stress: Not on file  Social Connections: Not on file  Intimate Partner Violence: Not on file    Family History:  Family History  Problem Relation Age of Onset   ADD / ADHD Mother    Anxiety disorder Mother    Depression Mother  Post-traumatic stress disorder Mother    Hypertension Father    Sleep apnea Father    ADD / ADHD Sister    Colon cancer Maternal Grandfather 61   Prostate cancer Neg Hx    Breast cancer Neg Hx    Stroke Neg Hx    Heart attack Neg Hx     Medications:   Current Outpatient Medications on File Prior to Visit  Medication Sig Dispense Refill   acetaminophen (TYLENOL) 500 MG tablet Take 500 mg by mouth every 6 (six) hours as needed.     amLODipine (NORVASC) 10 MG tablet Take 1 tablet (10 mg total) by mouth daily. 90 tablet 1   buPROPion (WELLBUTRIN XL) 300 MG 24 hr tablet Take 1 tablet (300 mg total) by mouth daily. 90 tablet 1   chlorthalidone (HYGROTON) 25 MG tablet Take 1 tablet (25 mg total) by  mouth daily. 90 tablet 1   losartan (COZAAR) 100 MG tablet Take 1 tablet (100 mg total) by mouth daily. 90 tablet 1   metoprolol succinate (TOPROL-XL) 25 MG 24 hr tablet Take 1 tablet (25 mg total) by mouth daily. 90 tablet 1   Multiple Vitamin (MULTIVITAMIN) tablet Take 1 tablet by mouth daily.     No current facility-administered medications on file prior to visit.    Allergies:   Allergies  Allergen Reactions   Penicillins     Reaction = unknown, was told as a child he had a reaction to it.       OBJECTIVE:  Physical Exam  Vitals:   07/29/22 1055  BP: (!) 160/86  Pulse: 90  Weight: (!) 364 lb 6.4 oz (165.3 kg)  Height: '5\' 11"'$  (1.803 m)    Body mass index is 50.82 kg/m. No results found.  General: Morbidly obese very pleasant young Caucasian male, seated, in no evident distress Head: head normocephalic and atraumatic.   Neck: supple with no carotid or supraclavicular bruits Cardiovascular: regular rate and rhythm, no murmurs Musculoskeletal: no deformity Skin:  no rash/petichiae Vascular:  Normal pulses all extremities   Neurologic Exam Mental Status: Awake and fully alert. Oriented to place and time. Recent and remote memory intact. Attention span, concentration and fund of knowledge appropriate. Mood and affect appropriate.  Cranial Nerves: Pupils equal, briskly reactive to light. Extraocular movements full without nystagmus. Visual fields full to confrontation. Hearing intact. Facial sensation intact. Face, tongue, palate moves normally and symmetrically.  Motor: Normal bulk and tone. Normal strength in all tested extremity muscles Sensory.: intact to touch , pinprick , position and vibratory sensation.  Coordination: Rapid alternating movements normal in all extremities. Finger-to-nose and heel-to-shin performed accurately bilaterally. Gait and Station: Arises from chair without difficulty. Stance is normal. Gait demonstrates normal stride length and balance  without use of AD. Tandem walk and heel toe without difficulty.  Reflexes: 1+ and symmetric. Toes downgoing.         ASSESSMENT: Barry Walker is a 32 y.o. year old male with recent diagnosis of severe OSA and nocturnal hypoxemia on 11/07/2021 and initiated AutoPap 11/23/2021.      PLAN:  OSA on CPAP : Compliance report shows satisfactory usage with optimal residual AHI.  Continue current pressure settings.  Discussed importance of continued nightly usage with greater than 4 hours per night for optimal benefit and per insurance requirements.  Continue to follow with DME company for any needed supplies or CPAP related concerns    Orders Placed This Encounter  Procedures   For home use  only DME continuous positive airway pressure (CPAP)    Order Specific Question:   Length of Need    Answer:   Lifetime    Order Specific Question:   Patient has OSA or probable OSA    Answer:   Yes    Order Specific Question:   Is the patient currently using CPAP in the home    Answer:   Yes    Order Specific Question:   If yes (to question two)    Answer:   Determine DME provider and inform them of any new orders/settings    Order Specific Question:   Date of face to face encounter    Answer:   07/29/2022    Order Specific Question:   Settings    Answer:   Autotitration    Order Specific Question:   CPAP supplies needed    Answer:   Mask, headgear, cushions, filters, heated tubing and water chamber      Follow up in 1 year or call earlier if needed   CC:  PCP: Inda Coke, PA    I spent 16 minutes of face-to-face and non-face-to-face time with patient.  This included previsit chart review, lab review, study review, order entry, electronic health record documentation, patient education regarding sleep apnea with review and discussion of compliance report and answered all other questions to patient's satisfaction   Frann Rider, [facility]  [facility] [address] [place], [place] [address]  Phone [phone] Fax [phone] Note: This document was prepared with digital dictation and possible smart phrase technology. Any transcriptional errors that result from this process are unintentional.

## 2022-07-29 ENCOUNTER — Ambulatory Visit: Payer: BC Managed Care – PPO | Admitting: Adult Health

## 2022-07-29 ENCOUNTER — Encounter: Payer: Self-pay | Admitting: Adult Health

## 2022-07-29 VITALS — BP 160/86 | HR 90 | Ht 71.0 in | Wt 364.4 lb

## 2022-07-29 DIAGNOSIS — G4730 Sleep apnea, unspecified: Secondary | ICD-10-CM | POA: Diagnosis not present

## 2022-07-29 DIAGNOSIS — Z9989 Dependence on other enabling machines and devices: Secondary | ICD-10-CM

## 2022-07-29 NOTE — Patient Instructions (Signed)
Continue nightly use of CPAP -continue to follow with your DME company [facility] for any needed supplies or CPAP related concerns  Please monitor blood pressure later this afternoon, if remains elevated please contact your PCP for further recommendations    Follow-up in 1 year or call earlier if needed

## 2022-08-13 ENCOUNTER — Other Ambulatory Visit: Payer: Self-pay | Admitting: Physician Assistant

## 2022-09-01 ENCOUNTER — Encounter: Payer: Self-pay | Admitting: Physician Assistant

## 2022-09-02 MED ORDER — BUPROPION HCL ER (XL) 300 MG PO TB24: 300.0000 mg | ORAL_TABLET | Freq: Every day | ORAL | 1 refills | Status: DC

## 2022-09-02 MED ORDER — LOSARTAN POTASSIUM 100 MG PO TABS: 100.0000 mg | ORAL_TABLET | Freq: Every day | ORAL | 1 refills | Status: DC

## 2022-09-02 MED ORDER — METOPROLOL SUCCINATE ER 25 MG PO TB24: 25.0000 mg | ORAL_TABLET | Freq: Every day | ORAL | 1 refills | Status: DC

## 2022-10-02 NOTE — Progress Notes (Shared)
Subjective:    Barry Walker is a 32 y.o. male and is here for a comprehensive physical exam.  HPI  There are no preventive care reminders to display for this patient.  Acute Concerns: ***  Chronic Issues: Allergies  Depression Treated with bupropion 300 mg daily.  GERD  Hyperlipidemia Currently not taking meds.  Hypertension Treated with losartan 100 mg daily, amlodipine 10 mg daily, metoprolol succinate 25 mg daily, chlorthalidone 25 mg daily.  Sleep Apnea  Health Maintenance: Immunizations -- *** Colonoscopy -- *** PSA -- No results found for: "PSA1", "PSA" Diet -- *** Sleep habits -- *** Exercise -- ***  Weight -- @FLOWAMB (14)@  Recent weight history Wt Readings from Last 10 Encounters:  07/29/22 (!) 364 lb 6.4 oz (165.3 kg)  03/20/22 (!) 364 lb 8 oz (165.3 kg)  01/23/22 (!) 362 lb (164.2 kg)  12/17/21 (!) 368 lb 6.1 oz (167.1 kg)  09/18/21 (!) 365 lb 3.2 oz (165.7 kg)  09/06/21 (!) 364 lb 12.8 oz (165.5 kg)  08/12/21 (!) 345 lb (156.5 kg)  06/18/21 (!) 351 lb (159.2 kg)  02/11/21 (!) 340 lb (154.2 kg)  12/15/20 (!) 351 lb 12.8 oz (159.6 kg)   There is no height or weight on file to calculate BMI.  Mood -- *** Alcohol use --  reports current alcohol use of about 3.0 standard drinks of alcohol per week.  Tobacco use --  Tobacco Use: Low Risk  (07/29/2022)   Patient History    Smoking Tobacco Use: Never    Smokeless Tobacco Use: Never    Passive Exposure: Not on file    Eligible for Low Dose CT?  UTD with eye doctor? *** UTD with dentist? ***     03/20/2022    8:16 AM  Depression screen PHQ 2/9  Decreased Interest 0  Down, Depressed, Hopeless 0  PHQ - 2 Score 0  Altered sleeping 0  Tired, decreased energy 0  Change in appetite 1  Feeling bad or failure about yourself  1  Trouble concentrating 0  Moving slowly or fidgety/restless 0  Suicidal thoughts 0  PHQ-9 Score 2  Difficult doing work/chores Not difficult at all    Other  providers/specialists: Patient Care Team: Jarold Motto, [place] as PCP - General (Physician Assistant)    PMHx, SurgHx, SocialHx, Medications, and Allergies were reviewed in the Visit Navigator and updated as appropriate.   Past Medical History:  Diagnosis Date   Allergy 02/10/1993   Penicilin   GERD (gastroesophageal reflux disease) 12/15/2020   HLD (hyperlipidemia) 08/22/2020   Hypertension 08/22/2020   Rectus diastasis 12/15/2020   Sleep apnea     No past surgical history on file.   Family History  Problem Relation Age of Onset   ADD / ADHD Mother    Anxiety disorder Mother    Depression Mother    Post-traumatic stress disorder Mother    Hypertension Father    Sleep apnea Father    ADD / ADHD Sister    Colon cancer Maternal Grandfather 15   Prostate cancer Neg Hx    Breast cancer Neg Hx    Stroke Neg Hx    Heart attack Neg Hx     Social History   Tobacco Use   Smoking status: Never   Smokeless tobacco: Never   Tobacco comments:    A cigar on special occasions, but no cigs/vape  Vaping Use   Vaping Use: Never used  Substance Use Topics   Alcohol use: Yes  Alcohol/week: 3.0 standard drinks of alcohol    Types: 3 Standard drinks or equivalent per week    Comment: Only drink on Saturday during a normal week   Drug use: Never    Review of Systems:   ROS  Objective:   There were no vitals filed for this visit.  There is no height or weight on file to calculate BMI.  General  Alert, cooperative, no distress, appears stated age  Head:  Normocephalic, without obvious abnormality, atraumatic  Eyes:  PERRL, conjunctiva/corneas clear, EOM's intact, fundi benign, both eyes       Ears:  Normal TM's and external ear canals, both ears  Nose: Nares normal, septum midline, mucosa normal, no drainage or sinus tenderness  Throat: Lips, mucosa, and tongue normal; teeth and gums normal  Neck: Supple, symmetrical, trachea midline, no adenopathy;     thyroid:  No  enlargement/tenderness/nodules; no carotid bruit or JVD  Back:   Symmetric, no curvature, ROM normal, no CVA tenderness  Lungs:   Clear to auscultation bilaterally, respirations unlabored  Chest wall:  No tenderness or deformity  Heart:  Regular rate and rhythm, S1 and S2 normal, no murmur, rub or gallop  Abdomen:   Soft, non-tender, bowel sounds active all four quadrants, no masses, no organomegaly  Extremities: Extremities normal, atraumatic, no cyanosis or edema  Prostate : ***   Skin: Skin color, texture, turgor normal, no rashes or lesions  Lymph nodes: Cervical, supraclavicular, and axillary nodes normal  Neurologic: CNII-XII grossly intact. Normal strength, sensation and reflexes throughout   AssessmentPlan:   ***   I,Alexander Ruley,acting as a scribe for [employer], PA.,have documented all relevant documentation on the behalf of Jarold Motto, PA,as directed by  Jarold Motto, PA while in the presence of Jarold Motto, [place].   ***    Jarold Motto, PA-C [facility]

## 2022-10-09 ENCOUNTER — Encounter: Payer: BC Managed Care – PPO | Admitting: Physician Assistant

## 2022-10-23 NOTE — Progress Notes (Signed)
Subjective:    Barry Walker is a 32 y.o. male and is here for a comprehensive physical exam.  HPI  There are no preventive care reminders to display for this patient.  Acute Concerns: None  Chronic Issues: Hypertension Treated with losartan 100 mg daily, metoprolol succinate 25 mg daily, amlodipine 10 mg daily, chlorthalidone 25 mg daily. Blood pressure readings at home are as follows: 124-135 systolic. Blood pressure slightly elevated at 136/90. No chest pain, SHORTNESS OF BREATH, lower extremity swelling.  Depression Treated with bupropion 300 mg daily. Currently not seeing counselor.  Sleep Apnea Stable with CPAP. Seen by sleep specialist annually.  Has lost 7 pounds between 03/20/22 and today. Not seen regularly by dermatology. Uses sunscreen. Denies swelling in ankles, GI symptoms.  Health Maintenance: Immunizations -- UTD on tetanus, flu vaccines. Colonoscopy -- N/A PSA -- No results found for: "PSA1", "PSA" Diet -- Eats healthy. Sleep habits -- Stable. Exercise -- Exercising 5 days a week- weight training, walking.  Weight --  Recent weight history Wt Readings from Last 10 Encounters:  10/30/22 (!) 357 lb (161.9 kg)  07/29/22 (!) 364 lb 6.4 oz (165.3 kg)  03/20/22 (!) 364 lb 8 oz (165.3 kg)  01/23/22 (!) 362 lb (164.2 kg)  12/17/21 (!) 368 lb 6.1 oz (167.1 kg)  09/18/21 (!) 365 lb 3.2 oz (165.7 kg)  09/06/21 (!) 364 lb 12.8 oz (165.5 kg)  08/12/21 (!) 345 lb (156.5 kg)  06/18/21 (!) 351 lb (159.2 kg)  02/11/21 (!) 340 lb (154.2 kg)   Body mass index is 49.79 kg/m.  Mood -- Stable. Alcohol use --  reports current alcohol use of about 3.0 standard drinks of alcohol per week.  Tobacco use --  Tobacco Use: Low Risk  (10/30/2022)   Patient History    Smoking Tobacco Use: Never    Smokeless Tobacco Use: Never    Passive Exposure: Not on file    Eligible for Low Dose CT? No  UTD with eye doctor? UTD UTD with dentist? UTD     10/30/2022     8:23 AM  Depression screen PHQ 2/9  Decreased Interest 0  Down, Depressed, Hopeless 0  PHQ - 2 Score 0  Altered sleeping 1  Tired, decreased energy 0  Change in appetite 0  Feeling bad or failure about yourself  0  Trouble concentrating 0  Moving slowly or fidgety/restless 0  Suicidal thoughts 0  PHQ-9 Score 1  Difficult doing work/chores Not difficult at all    Other providers/specialists: Patient Care Team: Jarold Motto, [place] as PCP - General (Physician Assistant)    PMHx, SurgHx, SocialHx, Medications, and Allergies were reviewed in the Visit Navigator and updated as appropriate.   Past Medical History:  Diagnosis Date   Allergy 12[birth year]92   Penicilin   GERD (gastroesophageal reflux disease) 12/15/2020   HLD (hyperlipidemia) 08/22/2020   Hypertension 08/22/2020   Rectus diastasis 12/15/2020   Sleep apnea     No past surgical history on file.   Family History  Problem Relation Age of Onset   ADD / ADHD Mother    Anxiety disorder Mother    Depression Mother    Post-traumatic stress disorder Mother    Hypertension Father    Sleep apnea Father    ADD / ADHD Sister    Colon cancer Maternal Grandfather 24   Prostate cancer Neg Hx    Breast cancer Neg Hx    Stroke Neg Hx    Heart attack Neg Hx  Social History   Tobacco Use   Smoking status: Never   Smokeless tobacco: Never   Tobacco comments:    None  Vaping Use   Vaping Use: Never used  Substance Use Topics   Alcohol use: Yes    Alcohol/week: 3.0 standard drinks of alcohol    Types: 3 Standard drinks or equivalent per week    Comment: Only drink on Saturday during a normal week   Drug use: Never    Review of Systems:   Review of Systems  Constitutional:  Negative for chills, fever, malaise/fatigue and weight loss.  HENT:  Negative for congestion, hearing loss, sinus pain and sore throat.   Eyes:  Negative for blurred vision.  Respiratory:  Negative for cough, hemoptysis and shortness of  breath.   Cardiovascular:  Negative for chest pain, palpitations, leg swelling and PND.  Gastrointestinal:  Negative for abdominal pain, constipation, diarrhea, heartburn, nausea and vomiting.  Genitourinary:  Negative for dysuria, frequency and urgency.  Musculoskeletal:  Negative for back pain, myalgias and neck pain.  Skin:  Negative for itching and rash.  Neurological:  Negative for dizziness, tingling, seizures, loss of consciousness and headaches.  Endo/Heme/Allergies:  Negative for polydipsia.  Psychiatric/Behavioral:  Negative for depression. The patient is not nervous/anxious.     Objective:    Vitals:   10/30/22 0824  BP: (!) 136/90  Pulse: 77  Temp: (!) 97.5 F (36.4 C)  SpO2: 99%    Body mass index is 49.79 kg/m.  General  Alert, cooperative, no distress, appears stated age  Head:  Normocephalic, without obvious abnormality, atraumatic  Eyes:  PERRL, conjunctiva/corneas clear, EOM's intact, fundi benign, both eyes       Ears:  Normal TM's and external ear canals, both ears  Nose: Nares normal, septum midline, mucosa normal, no drainage or sinus tenderness  Throat: Lips, mucosa, and tongue normal; teeth and gums normal  Neck: Supple, symmetrical, trachea midline, no adenopathy;     thyroid:  No enlargement/tenderness/nodules; no carotid bruit or JVD  Back:   Symmetric, no curvature, ROM normal, no CVA tenderness  Lungs:   Clear to auscultation bilaterally, respirations unlabored  Chest wall:  No tenderness or deformity  Heart:  Regular rate and rhythm, S1 and S2 normal, no murmur, rub or gallop  Abdomen:   Soft, non-tender, bowel sounds active all four quadrants, no masses, no organomegaly  Extremities: Extremities normal, atraumatic, no cyanosis or edema  Prostate : Deferred   Skin: Skin color, texture, turgor normal, no rashes or lesions  Lymph nodes: Cervical, supraclavicular, and axillary nodes normal  Neurologic: CNII-XII grossly intact. Normal strength,  sensation and reflexes throughout   AssessmentPlan:   Routine physical examination Today patient counseled on age appropriate routine health concerns for screening and prevention, each reviewed and up to date or declined. Immunizations reviewed and up to date or declined. Labs ordered and reviewed. Risk factors for depression reviewed and negative. Hearing function and visual acuity are intact. ADLs screened and addressed as needed. Functional ability and level of safety reviewed and appropriate. Education, counseling and referrals performed based on assessed risks today. Patient provided with a copy of personalized plan for preventive services.  Primary hypertension Normotensive at home Continue losartan 100 mg daily, metoprolol succinate 25 mg daily, amlodipine 10 mg daily, chlorthalidone 25 mg daily Continue to monitor BP at home Follow-up in 6 months  OSA (obstructive sleep apnea) Compliant with CPAP  Depression, recurrent (HCC) Well controlled Continue Wellbutrin 300 mg XL  daily Follow-up in 1 year, sooner if concerns  Insulin resistance Update A1c and provide recommendations accordingly  Obesity, unspecified classification, unspecified obesity type, unspecified whether serious comorbidity present Continue healthy diet and exercise  Hyperlipidemia, unspecified hyperlipidemia type Update lipid panel and make recommendations accordingly  I,Alexander Ruley,acting as a scribe for Jarold Motto, PA.,have documented all relevant documentation on the behalf of Jarold Motto, PA,as directed by  Jarold Motto, PA while in the presence of Jarold Motto, [place].  I, Jarold Motto, [place], have reviewed all documentation for this visit. The documentation on 10/30/22 for the exam, diagnosis, procedures, and orders are all accurate and complete.   Jarold Motto, PA-C [facility]

## 2022-10-30 ENCOUNTER — Ambulatory Visit (INDEPENDENT_AMBULATORY_CARE_PROVIDER_SITE_OTHER): Payer: BC Managed Care – PPO | Admitting: Physician Assistant

## 2022-10-30 ENCOUNTER — Encounter: Payer: Self-pay | Admitting: Physician Assistant

## 2022-10-30 VITALS — BP 128/82 | HR 77 | Temp 97.5°F | Ht 71.0 in | Wt 357.0 lb

## 2022-10-30 DIAGNOSIS — E88819 Insulin resistance, unspecified: Secondary | ICD-10-CM | POA: Diagnosis not present

## 2022-10-30 DIAGNOSIS — E669 Obesity, unspecified: Secondary | ICD-10-CM

## 2022-10-30 DIAGNOSIS — G4733 Obstructive sleep apnea (adult) (pediatric): Secondary | ICD-10-CM

## 2022-10-30 DIAGNOSIS — E785 Hyperlipidemia, unspecified: Secondary | ICD-10-CM | POA: Diagnosis not present

## 2022-10-30 DIAGNOSIS — F339 Major depressive disorder, recurrent, unspecified: Secondary | ICD-10-CM

## 2022-10-30 DIAGNOSIS — I10 Essential (primary) hypertension: Secondary | ICD-10-CM | POA: Diagnosis not present

## 2022-10-30 DIAGNOSIS — Z0000 Encounter for general adult medical examination without abnormal findings: Secondary | ICD-10-CM

## 2022-10-30 LAB — COMPREHENSIVE METABOLIC PANEL
ALT: 26 U/L (ref 0–53)
AST: 18 U/L (ref 0–37)
Albumin: 4.2 g/dL (ref 3.5–5.2)
Alkaline Phosphatase: 66 U/L (ref 39–117)
BUN: 11 mg/dL (ref 6–23)
CO2: 29 mEq/L (ref 19–32)
Calcium: 9.4 mg/dL (ref 8.4–10.5)
Chloride: 98 mEq/L (ref 96–112)
Creatinine, Ser: 0.82 mg/dL (ref 0.40–1.50)
GFR: 118.61 mL/min (ref >60.00)
Glucose, Bld: 102 mg/dL — ABNORMAL HIGH (ref 70–99)
Potassium: 3.4 mEq/L — ABNORMAL LOW (ref 3.5–5.1)
Sodium: 138 mEq/L (ref 135–145)
Total Bilirubin: 0.8 mg/dL (ref 0.2–1.2)
Total Protein: 7.3 g/dL (ref 6.0–8.3)

## 2022-10-30 LAB — CBC WITH DIFFERENTIAL/PLATELET
Basophils Absolute: 0 10*3/uL (ref 0.0–0.1)
Basophils Relative: 0.5 % (ref 0.0–3.0)
Eosinophils Absolute: 0.2 10*3/uL (ref 0.0–0.7)
Eosinophils Relative: 3.2 % (ref 0.0–5.0)
HCT: 44.8 % (ref 39.0–52.0)
Hemoglobin: 15 g/dL (ref 13.0–17.0)
Lymphocytes Relative: 29.6 % (ref 12.0–46.0)
Lymphs Abs: 2.3 10*3/uL (ref 0.7–4.0)
MCHC: 33.5 g/dL (ref 30.0–36.0)
MCV: 89.8 fl (ref 78.0–100.0)
Monocytes Absolute: 0.7 10*3/uL (ref 0.1–1.0)
Monocytes Relative: 8.8 % (ref 3.0–12.0)
Neutro Abs: 4.5 10*3/uL (ref 1.4–7.7)
Neutrophils Relative %: 57.9 % (ref 43.0–77.0)
Platelets: 317 10*3/uL (ref 150.0–400.0)
RBC: 4.98 Mil/uL (ref 4.22–5.81)
RDW: 13.6 % (ref 11.5–15.5)
WBC: 7.8 10*3/uL (ref 4.0–10.5)

## 2022-10-30 LAB — LIPID PANEL
Cholesterol: 201 mg/dL — ABNORMAL HIGH (ref 0–200)
HDL: 33.8 mg/dL — ABNORMAL LOW (ref >39.00)
NonHDL: 166.98
Total CHOL/HDL Ratio: 6
Triglycerides: 332 mg/dL — ABNORMAL HIGH (ref 0.0–149.0)
VLDL: 66.4 mg/dL — ABNORMAL HIGH (ref 0.0–40.0)

## 2022-10-30 LAB — HEMOGLOBIN A1C: Hgb A1c MFr Bld: 5.8 % (ref 4.6–6.5)

## 2022-10-30 LAB — LDL CHOLESTEROL, DIRECT: Direct LDL: 137 mg/dL

## 2022-10-30 MED ORDER — CHLORTHALIDONE 25 MG PO TABS
25.0000 mg | ORAL_TABLET | Freq: Every day | ORAL | 3 refills | Status: DC
Start: 2022-10-30 — End: 2023-12-22

## 2022-10-30 MED ORDER — AMLODIPINE BESYLATE 10 MG PO TABS: 10.0000 mg | ORAL_TABLET | Freq: Every day | ORAL | 3 refills | Status: DC

## 2022-10-30 MED ORDER — BUPROPION HCL ER (XL) 300 MG PO TB24: 300.0000 mg | ORAL_TABLET | Freq: Every day | ORAL | 3 refills | Status: DC

## 2022-10-30 MED ORDER — LOSARTAN POTASSIUM 100 MG PO TABS: 100.0000 mg | ORAL_TABLET | Freq: Every day | ORAL | 3 refills | Status: DC

## 2022-10-30 MED ORDER — METOPROLOL SUCCINATE ER 25 MG PO TB24: 25.0000 mg | ORAL_TABLET | Freq: Every day | ORAL | 3 refills | Status: DC

## 2022-10-30 NOTE — Patient Instructions (Addendum)
It was great to see you! ? ?Please go to the lab for blood work.  ? ?Our office will call you with your results unless you have chosen to receive results via MyChart. ? ?If your blood work is normal we will follow-up each year for physicals and as scheduled for chronic medical problems. ? ?If anything is abnormal we will treat accordingly and get you in for a follow-up. ? ?Take care, ? ?Barry Walker ?  ? ? ?

## 2023-04-21 IMAGING — DX DG CHEST 2V
2 series · 2 of 2 positions shown · non-contrast
Comparison: None.

CLINICAL DATA: Chest pain.

EXAM:
CHEST - 2 VIEW

[chest pa]
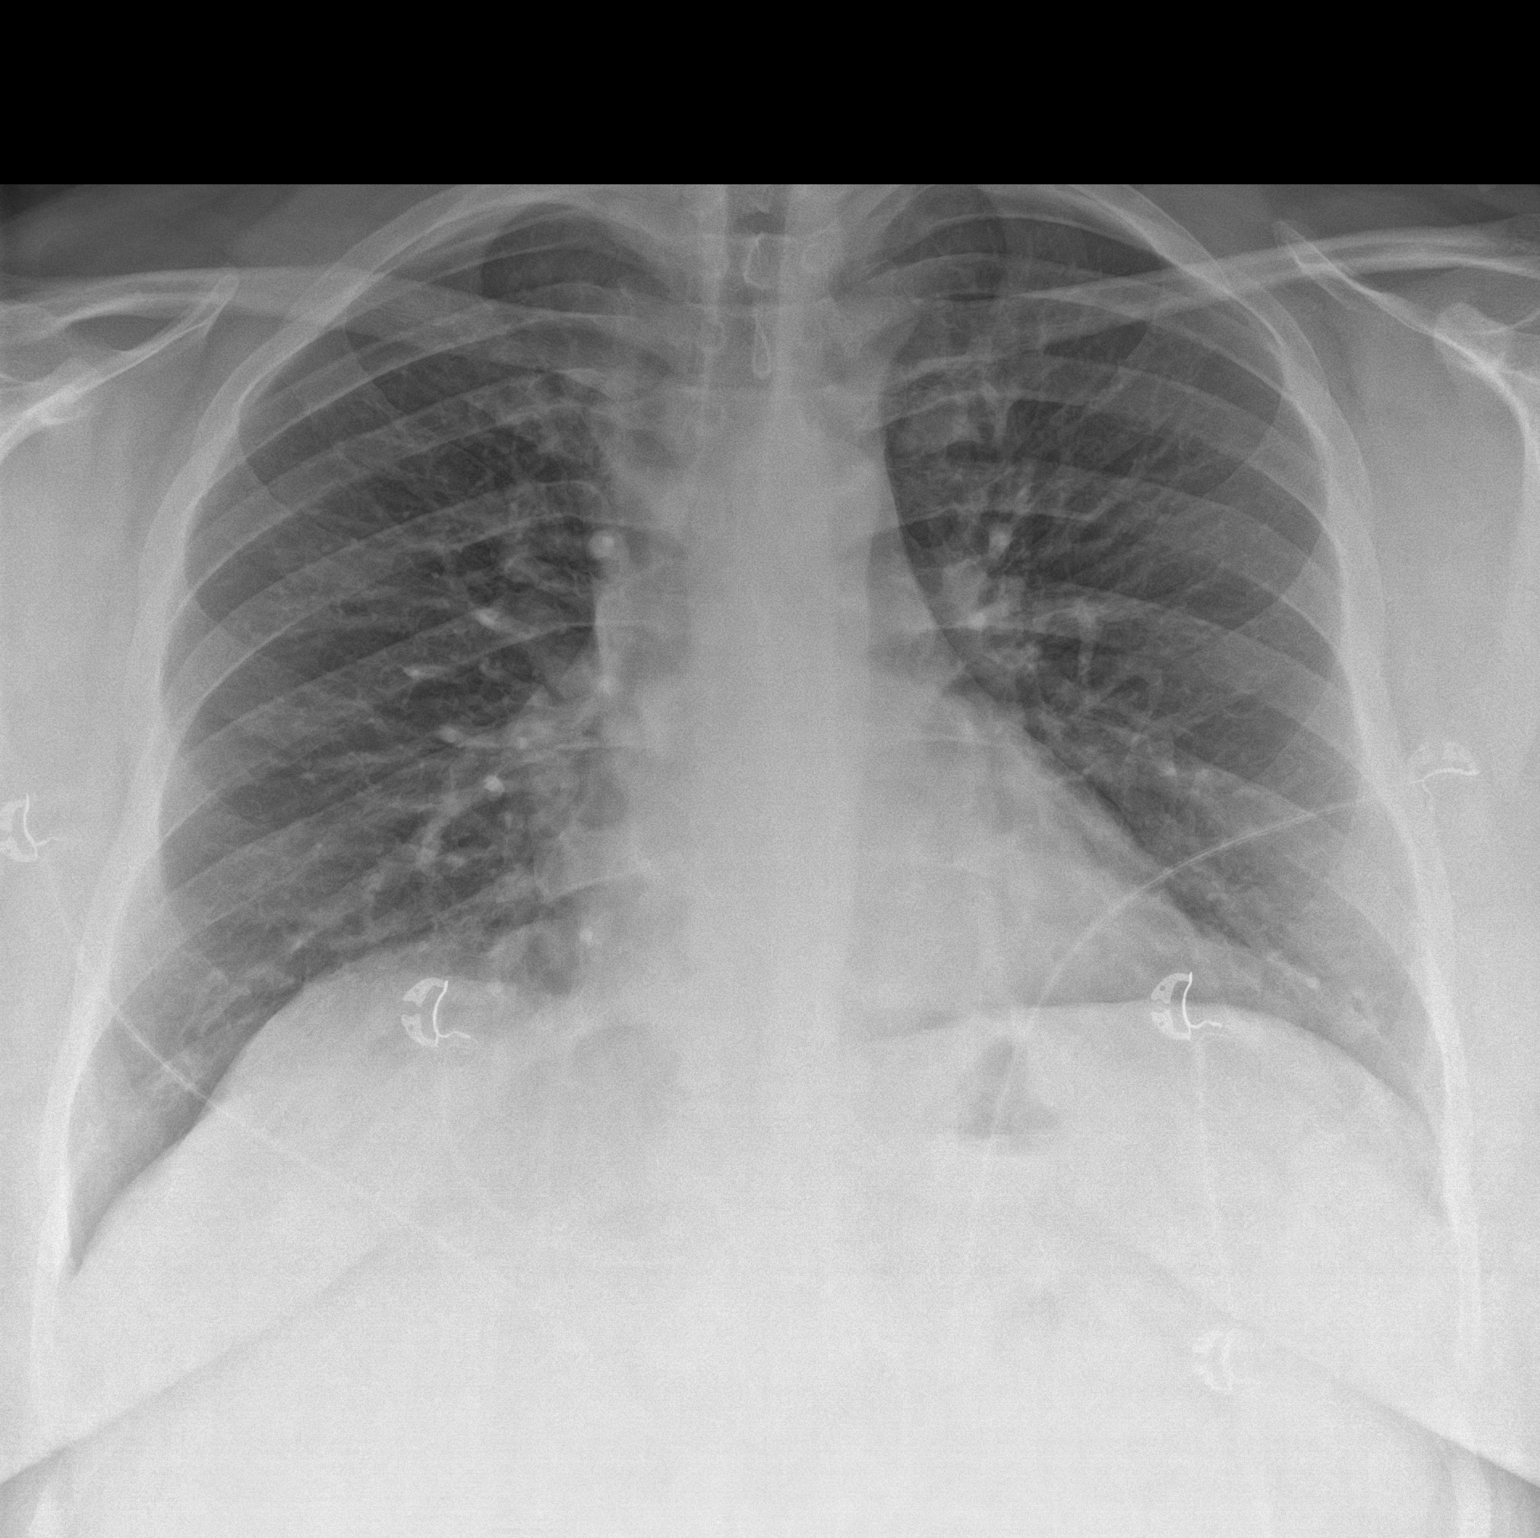

[chest lat]
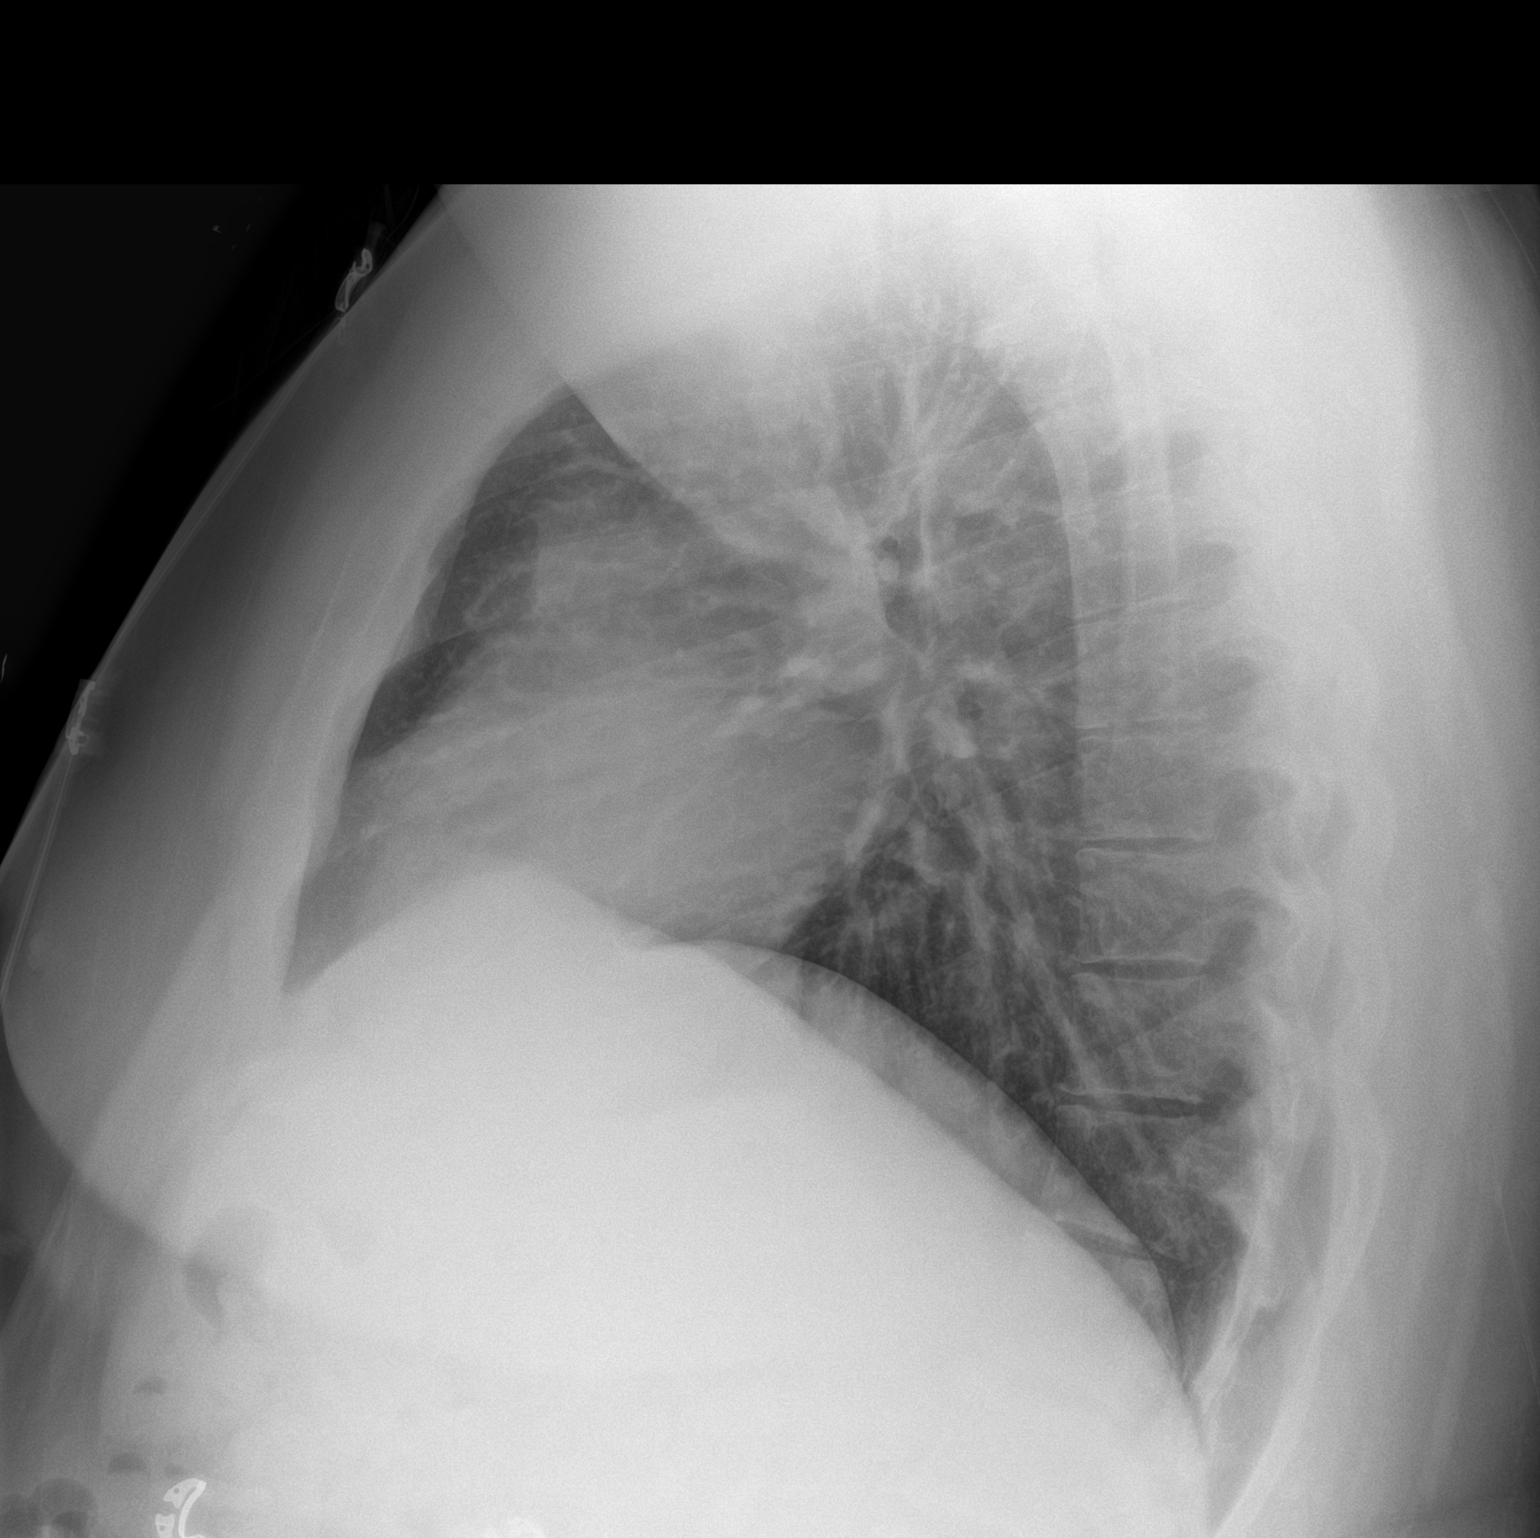

[2 of 2 positions shown; findings below may reference images not displayed]

FINDINGS: The heart size and mediastinal contours are within normal limits.
Both lungs are clear. The visualized skeletal structures are
unremarkable.
IMPRESSION: No active cardiopulmonary disease.

## 2023-07-29 NOTE — Progress Notes (Unsigned)
 [facility] [address] [place] [address] [phone]       OFFICE FOLLOW UP NOTE  [name] Date of Birth:  1991/05/08 Medical Record Number:  [id number]    Primary neurologist: Dr. Frances Furbish Reason for visit: CPAP follow-up    SUBJECTIVE:   CHIEF COMPLAINT:  No chief complaint on file.  Follow-up visit:  Prior visit: 07/29/2022  Brief HPI:   Barry Walker is a 33 y.o. male who is being followed for OSA on CPAP. Completed HST 6/7 which showed severe OSA with total AHI of 79.5/h and O2 nadir 72% with significant time below are at 88% saturation for over 30 minutes of the night indicating nocturnal hypoxia.  Recommend initiating AutoPap.   At prior visit, compliance report showed satisfactory usage and optimal residual AHI.  No questions or concerns at visit.  Interval history:   Returns today for yearly CPAP compliance visit.  Compliance report as below showing excellent usage and optimal residual AHI.              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  Social History:  Social History   Socioeconomic History   Marital status: Single    Spouse name: Not on file   Number of children: Not on file   Years of education: Not on file   Highest education level: Not on file  Occupational History   Not on file  Tobacco Use   Smoking status: Never   Smokeless tobacco: Never   Tobacco comments:    None  Vaping Use   Vaping status: Never Used  Substance and Sexual Activity   Alcohol use: Yes    Alcohol/week: 3.0 standard drinks of alcohol    Types: 3 Standard drinks or equivalent  per week    Comment: Only drink on Saturday during a normal week   Drug use: Never   Sexual activity: Not Currently    Birth control/protection: Condom  Other Topics Concern   Not on file  Social History Narrative   Dietitian for [employer]   Grew up in [place]   Has been in [place] since sept 2018.     Caffeine water, sugar free lemonade.    Social Drivers of Corporate investment banker Strain: Not on file  Food Insecurity: Not on file  Transportation Needs: Not on file  Physical Activity: Not on file  Stress: Not on file  Social Connections: Not on file  Intimate Partner Violence: Not on file    Family History:  Family History  Problem Relation Age of Onset   ADD / ADHD Mother    Anxiety disorder Mother    Depression Mother    Post-traumatic stress disorder Mother    Hypertension Father    Sleep apnea Father    ADD / ADHD Sister    Colon cancer Maternal Grandfather 12   Prostate cancer Neg Hx    Breast cancer Neg Hx    Stroke Neg Hx    Heart attack Neg Hx     Medications:   Current Outpatient Medications on File Prior to Visit  Medication Sig Dispense Refill   acetaminophen (TYLENOL) 500 MG tablet Take 500 mg by mouth every 6 (six) hours as needed.     amLODipine (NORVASC) 10 MG tablet Take 1 tablet (10 mg total) by mouth daily. 90 tablet 3   buPROPion (WELLBUTRIN XL) 300 MG 24 hr tablet Take 1 tablet (300 mg total) by mouth daily. 90 tablet 3   chlorthalidone (HYGROTON) 25 MG tablet Take 1 tablet (25 mg total) by mouth daily. 90 tablet 3   losartan (COZAAR) 100 MG tablet Take 1 tablet (100 mg total) by mouth daily. 90 tablet 3   metoprolol succinate (TOPROL-XL) 25 MG 24 hr tablet Take 1 tablet (25 mg total) by mouth daily. 90 tablet 3   Multiple Vitamin (MULTIVITAMIN) tablet Take 1 tablet by mouth daily.     No current facility-administered medications on file prior to visit.    Allergies:   Allergies  Allergen Reactions   Penicillins     Reaction =  unknown, was told as a child he had a reaction to it.       OBJECTIVE:  Physical Exam  There were no vitals filed for this visit.   There is no height or weight on file to calculate BMI. No results found.  General: Morbidly obese very pleasant young Caucasian male, seated, in no evident distress Head: head normocephalic and atraumatic.   Neck: supple with no carotid or supraclavicular bruits Cardiovascular: regular rate and rhythm, no murmurs Musculoskeletal: no deformity Skin:  no rash/petichiae Vascular:  Normal pulses all extremities   Neurologic Exam Mental Status: Awake and fully alert. Oriented to place and time. Recent and remote memory intact. Attention span, concentration and fund of knowledge appropriate. Mood and affect appropriate.  Cranial Nerves: Pupils equal, briskly reactive to light. Extraocular movements full without nystagmus. Visual fields full to confrontation. Hearing intact. Facial sensation intact. Face, tongue, palate moves normally and symmetrically.  Motor: Normal bulk and tone. Normal strength in all tested extremity muscles Sensory.: intact to touch , pinprick , position and vibratory sensation.  Coordination: Rapid alternating movements normal in all extremities. Finger-to-nose and heel-to-shin performed accurately bilaterally. Gait and Station: Arises from chair without difficulty. Stance is normal. Gait demonstrates normal stride length and balance without use of AD. Tandem walk and heel toe without difficulty.  Reflexes: 1+ and symmetric. Toes downgoing.         ASSESSMENT: Barry Walker is a 33 y.o. year old male with recent diagnosis of severe OSA and nocturnal hypoxemia on 11/07/2021 and initiated AutoPap 11/23/2021.      PLAN:  OSA on CPAP : Compliance report shows satisfactory usage with optimal residual AHI.  Continue current pressure settings of 7-15 with EPR 3.  Discussed importance of continued nightly usage with greater than 4  hours per night for optimal benefit and per insurance requirements.  Continue to follow with DME company for any needed supplies or CPAP related concerns    No orders of the defined types were placed in this encounter.     Follow up in 1 year or call earlier if needed   CC:  PCP: Jarold Motto, PA    I spent 16 minutes of face-to-face and non-face-to-face time with patient.  This included previsit chart review, lab review, study review, order entry, electronic health record documentation, patient education regarding sleep apnea with review and discussion of compliance report and answered all other questions to patient's satisfaction   Ihor Austin, AGNP-BC  [facility] [phone]  [address] [place], [place] [address]  Phone [phone] Fax [phone] Note: This document was prepared with digital dictation and possible smart phrase technology. Any transcriptional errors that result from this process are unintentional.

## 2023-07-30 ENCOUNTER — Encounter: Payer: Self-pay | Admitting: Adult Health

## 2023-07-30 ENCOUNTER — Ambulatory Visit: Payer: BC Managed Care – PPO | Admitting: Adult Health

## 2023-07-30 VITALS — BP 136/89 | HR 93 | Ht 71.0 in | Wt 362.0 lb

## 2023-07-30 DIAGNOSIS — G4733 Obstructive sleep apnea (adult) (pediatric): Secondary | ICD-10-CM

## 2023-07-30 NOTE — Patient Instructions (Signed)
 Your Plan:  Continue nightly use of CPAP for adequate sleep apnea management  Continue to follow with your DME company [facility] for any needs supplies or CPAP related concerns    Follow-up in 1 year or call earlier if needed     Thank you for coming to see [place] at [facility]. I hope we have been able to provide you high quality care today.  You may receive a patient satisfaction survey over the next few weeks. We would appreciate your feedback and comments so that we may continue to improve ourselves and the health of our patients.

## 2023-09-09 ENCOUNTER — Other Ambulatory Visit: Payer: Self-pay | Admitting: Physician Assistant

## 2023-09-27 ENCOUNTER — Other Ambulatory Visit: Payer: Self-pay | Admitting: Physician Assistant

## 2023-10-06 ENCOUNTER — Encounter: Payer: Self-pay | Admitting: Physician Assistant

## 2023-10-07 ENCOUNTER — Encounter: Payer: Self-pay | Admitting: Physician Assistant

## 2023-10-07 ENCOUNTER — Ambulatory Visit: Admitting: Physician Assistant

## 2023-10-07 VITALS — BP 161/103 | HR 88 | Temp 98.1°F | Ht 71.0 in | Wt 360.5 lb

## 2023-10-07 DIAGNOSIS — E88819 Insulin resistance, unspecified: Secondary | ICD-10-CM

## 2023-10-07 DIAGNOSIS — E669 Obesity, unspecified: Secondary | ICD-10-CM

## 2023-10-07 DIAGNOSIS — L989 Disorder of the skin and subcutaneous tissue, unspecified: Secondary | ICD-10-CM | POA: Diagnosis not present

## 2023-10-07 DIAGNOSIS — I10 Essential (primary) hypertension: Secondary | ICD-10-CM | POA: Diagnosis not present

## 2023-10-07 LAB — LIPID PANEL
Cholesterol: 232 mg/dL — ABNORMAL HIGH (ref 0–200)
HDL: 34.7 mg/dL — ABNORMAL LOW (ref >39.00)
LDL Cholesterol: 145 mg/dL — ABNORMAL HIGH (ref 0–99)
NonHDL: 197.33
Total CHOL/HDL Ratio: 7
Triglycerides: 261 mg/dL — ABNORMAL HIGH (ref 0.0–149.0)
VLDL: 52.2 mg/dL — ABNORMAL HIGH (ref 0.0–40.0)

## 2023-10-07 LAB — TSH: TSH: 1.35 u[IU]/mL (ref 0.35–5.50)

## 2023-10-07 LAB — COMPREHENSIVE METABOLIC PANEL WITH GFR
ALT: 29 U/L (ref 0–53)
AST: 21 U/L (ref 0–37)
Albumin: 4.8 g/dL (ref 3.5–5.2)
Alkaline Phosphatase: 63 U/L (ref 39–117)
BUN: 13 mg/dL (ref 6–23)
CO2: 27 meq/L (ref 19–32)
Calcium: 10.3 mg/dL (ref 8.4–10.5)
Chloride: 98 meq/L (ref 96–112)
Creatinine, Ser: 0.93 mg/dL (ref 0.40–1.50)
GFR: 110.15 mL/min (ref >60.00)
Glucose, Bld: 154 mg/dL — ABNORMAL HIGH (ref 70–99)
Potassium: 3.1 meq/L — ABNORMAL LOW (ref 3.5–5.1)
Sodium: 137 meq/L (ref 135–145)
Total Bilirubin: 0.8 mg/dL (ref 0.2–1.2)
Total Protein: 8.1 g/dL (ref 6.0–8.3)

## 2023-10-07 LAB — CBC WITH DIFFERENTIAL/PLATELET
Basophils Absolute: 0 10*3/uL (ref 0.0–0.1)
Basophils Relative: 0.4 % (ref 0.0–3.0)
Eosinophils Absolute: 0.1 10*3/uL (ref 0.0–0.7)
Eosinophils Relative: 1.3 % (ref 0.0–5.0)
HCT: 48 % (ref 39.0–52.0)
Hemoglobin: 16.3 g/dL (ref 13.0–17.0)
Lymphocytes Relative: 18.1 % (ref 12.0–46.0)
Lymphs Abs: 1.3 10*3/uL (ref 0.7–4.0)
MCHC: 33.9 g/dL (ref 30.0–36.0)
MCV: 91.6 fl (ref 78.0–100.0)
Monocytes Absolute: 0.6 10*3/uL (ref 0.1–1.0)
Monocytes Relative: 8.6 % (ref 3.0–12.0)
Neutro Abs: 5.3 10*3/uL (ref 1.4–7.7)
Neutrophils Relative %: 71.6 % (ref 43.0–77.0)
Platelets: 336 10*3/uL (ref 150.0–400.0)
RBC: 5.25 Mil/uL (ref 4.22–5.81)
RDW: 13.8 % (ref 11.5–15.5)
WBC: 7.4 10*3/uL (ref 4.0–10.5)

## 2023-10-07 LAB — HEMOGLOBIN A1C: Hgb A1c MFr Bld: 6 % (ref 4.6–6.5)

## 2023-10-07 NOTE — Progress Notes (Signed)
 Barry Walker is a 33 y.o. male here for a new problem.  History of Present Illness:   Chief Complaint  Patient presents with   Skin issue    Pt c/o bilateral palms of hands and pigmented red/pink since Friday. Denies numbness or tingling.   Red/Pink palms: Pt complains of red/pink palms, first noticed Friday morning.  He reports upper abdominal pressure when bending over to tie his shoes that morning.  He became concerned with the increased pink/redness while on a walk, and reports increased anxiety after Googling possible causes.  He is compliant with all prescribed medications: Amlodipine , Wellbutrin  XL, Chlorthalidone , Losartan , Toprol -XL.  He also takes fish oil and multivitamins daily.  He reports his mental health is well controlled.  Reports good sleep quality, is compliant with CPAP.  Tolerating his CPAP mask well, though he endorses dry mouth in the mornings.  No recent changes to his alcohol consumption.  He drinks about 7-8 drinks on weekends.  He typically has decaffeinated tea with lemon and water on weekdays.  Eats a well-balanced diet; not vegetarian or vegan.  He reports his BP was 133/83 when last checked.  Reports he has not previously had a diastolic reading over 90.  Reports fmhx of colon cancer (maternal grandfather).  Denies any jaundice of skin or eyes.  Denies any pain, limitations to ROM, numbness, or tingling.   HTN Currently taking amlodipine  10 mg daily, chlorthalidone  25 mg, losartan  100 mg. At home blood pressure readings are: normal per patient. Patient denies chest pain, SOB, blurred vision, dizziness, unusual headaches, lower leg swelling. Patient is  compliant with medication. Denies excessive caffeine intake, stimulant usage, excessive alcohol intake, or increase in salt consumption.  BP Readings from Last 3 Encounters:  10/07/23 (!) 161/103  07/30/23 136/89  10/30/22 128/82     Past Medical History:  Diagnosis Date   Allergy 04-29[birth year]93    Penicilin   GERD (gastroesophageal reflux disease) 12/15/2020   HLD (hyperlipidemia) 08/22/2020   Hypertension 08/22/2020   Rectus diastasis 12/15/2020   Sleep apnea      Social History   Tobacco Use   Smoking status: Never   Smokeless tobacco: Never   Tobacco comments:    None  Vaping Use   Vaping status: Never Used  Substance Use Topics   Alcohol use: Yes    Alcohol/week: 3.0 standard drinks of alcohol    Types: 3 Standard drinks or equivalent per week    Comment: Only drink on Saturday during a normal week   Drug use: Never    No past surgical history on file.  Family History  Problem Relation Age of Onset   ADD / ADHD Mother    Anxiety disorder Mother    Depression Mother    Post-traumatic stress disorder Mother    Hypertension Father    Sleep apnea Father    ADD / ADHD Sister    Colon cancer Maternal Grandfather 39   Prostate cancer Neg Hx    Breast cancer Neg Hx    Stroke Neg Hx    Heart attack Neg Hx     Allergies  Allergen Reactions   Penicillins     Reaction = unknown, was told as a child he had a reaction to it.     Current Medications:   Current Outpatient Medications:    acetaminophen  (TYLENOL ) 500 MG tablet, Take 500 mg by mouth every 6 (six) hours as needed., Disp: , Rfl:    amLODipine  (NORVASC ) 10 MG tablet,  TAKE 1 TABLET BY MOUTH EVERY DAY, Disp: 90 tablet, Rfl: 3   buPROPion  (WELLBUTRIN  XL) 300 MG 24 hr tablet, TAKE 1 TABLET BY MOUTH EVERY DAY, Disp: 90 tablet, Rfl: 3   chlorthalidone  (HYGROTON ) 25 MG tablet, Take 1 tablet (25 mg total) by mouth daily., Disp: 90 tablet, Rfl: 3   losartan  (COZAAR ) 100 MG tablet, TAKE 1 TABLET BY MOUTH EVERY DAY, Disp: 90 tablet, Rfl: 3   metoprolol  succinate (TOPROL -XL) 25 MG 24 hr tablet, TAKE 1 TABLET (25 MG TOTAL) BY MOUTH DAILY., Disp: 90 tablet, Rfl: 3   Multiple Vitamin (MULTIVITAMIN) tablet, Take 1 tablet by mouth daily., Disp: , Rfl:    Omega-3 Fatty Acids (FISH OIL PO), Take 1 capsule by mouth daily  in the afternoon., Disp: , Rfl:    Review of Systems:   Negative unless otherwise specified per HPI.  Vitals:   Vitals:   10/07/23 1140 10/07/23 1158  BP: (!) 154/103 (!) 161/103  Pulse: 88   Temp: 98.1 F (36.7 C)   TempSrc: Temporal   SpO2: 98%   Weight: (!) 360 lb 8 oz (163.5 kg)   Height: 5\' 11"  (1.803 m)      Body mass index is 50.28 kg/m.  Physical Exam:   Physical Exam Vitals and nursing note reviewed.  Constitutional:      Appearance: He is well-developed.  HENT:     Head: Normocephalic.  Eyes:     Conjunctiva/sclera: Conjunctivae normal.     Pupils: Pupils are equal, round, and reactive to light.  Cardiovascular:     Comments: Normal capillary refill to bilateral fingers Pulmonary:     Effort: Pulmonary effort is normal.  Musculoskeletal:        General: Normal range of motion.     Cervical back: Normal range of motion.  Skin:    General: Skin is warm and dry.     Comments: No obvious lesions or abnormalities to palms  Neurological:     Mental Status: He is alert and oriented to person, place, and time.  Psychiatric:        Behavior: Behavior normal.        Thought Content: Thought content normal.        Judgment: Judgment normal.     Assessment and Plan:   1. Primary hypertension (Primary) Above goal today No evidence of end-organ damage on my exam Recommend patient monitor home blood pressure at least a few times weekly Continue amlodipine  10 mg daily, chlorthalidone  25 mg, losartan  100 mg daily If home monitoring shows consistent elevation, or any symptom(s) develop, recommend reach out to us  for further advice on next steps - CBC with Differential/Platelet - Comprehensive metabolic panel with GFR - TSH  2. Insulin resistance Update Hemoglobin A1c and provide recommendations  - Hemoglobin A1c  3. Obesity, unspecified class, unspecified obesity type, unspecified whether serious comorbidity present Continue efforts at healthy diet and  exercise - CBC with Differential/Platelet - Comprehensive metabolic panel with GFR - TSH - Lipid panel - Hemoglobin A1c   4. Skin abnormality No obvious lesions or abnormalities Will update blood work If worsens, will refer to dermatology for further evaluation  I, Bernita Bristle, acting as a scribe for Alexander Iba, [place]., have documented all relevant documentation on the behalf of Alexander Iba, [place], as directed by   while in the presence of Alexander Iba, [place].  I, Alexander Iba, [place], have reviewed all documentation for this visit. The documentation on 10/07/23 for the exam,  diagnosis, procedures, and orders are all accurate and complete.  Alexander Iba, PA-C

## 2023-10-08 ENCOUNTER — Encounter: Payer: Self-pay | Admitting: Physician Assistant

## 2023-10-08 ENCOUNTER — Other Ambulatory Visit: Payer: Self-pay | Admitting: Physician Assistant

## 2023-10-08 DIAGNOSIS — E876 Hypokalemia: Secondary | ICD-10-CM

## 2023-10-23 ENCOUNTER — Other Ambulatory Visit (INDEPENDENT_AMBULATORY_CARE_PROVIDER_SITE_OTHER)

## 2023-10-23 DIAGNOSIS — E876 Hypokalemia: Secondary | ICD-10-CM | POA: Diagnosis not present

## 2023-10-23 LAB — BASIC METABOLIC PANEL WITH GFR
BUN: 12 mg/dL (ref 6–23)
CO2: 30 meq/L (ref 19–32)
Calcium: 9.3 mg/dL (ref 8.4–10.5)
Chloride: 97 meq/L (ref 96–112)
Creatinine, Ser: 0.9 mg/dL (ref 0.40–1.50)
GFR: 114.53 mL/min (ref >60.00)
Glucose, Bld: 157 mg/dL — ABNORMAL HIGH (ref 70–99)
Potassium: 3.1 meq/L — ABNORMAL LOW (ref 3.5–5.1)
Sodium: 135 meq/L (ref 135–145)

## 2023-10-24 ENCOUNTER — Other Ambulatory Visit: Payer: Self-pay | Admitting: Physician Assistant

## 2023-10-24 ENCOUNTER — Ambulatory Visit: Payer: Self-pay | Admitting: Physician Assistant

## 2023-10-24 DIAGNOSIS — I10 Essential (primary) hypertension: Secondary | ICD-10-CM

## 2023-10-24 MED ORDER — POTASSIUM CHLORIDE CRYS ER 20 MEQ PO TBCR: 20.0000 meq | EXTENDED_RELEASE_TABLET | Freq: Every day | ORAL | 0 refills | Status: DC

## 2023-10-31 ENCOUNTER — Encounter: Payer: BC Managed Care – PPO | Admitting: Physician Assistant

## 2023-11-06 NOTE — Progress Notes (Signed)
 Subjective:    Barry Walker is a 33 y.o. male and is here for a comprehensive physical exam.  HPI  There are no preventive care reminders to display for this patient.  Acute Concerns: None  Chronic Issues: HTN Currently on Amlodipine   10 mg daily, Chlorthalidone   25 mg daily, Losartan   100 mg daily, and Toprol -XL 25 mg daily.  Good compliance and tolerance to current regimen. At home BP readings are 120-130/70-85  Denies chest pain, SOB, blurred vision, dizziness, unusual headaches, lower leg swelling.   Denies excessive caffeine intake, stimulant usage, excessive alcohol intake, or increase in salt consumption.    Depression Moods are overall stable and well controlled.   Takes Wellbutrin  XL  300 mg daily Compliant and tolerating well   Denies any SI/HI.    Health Maintenance: Immunizations -- UTD Colonoscopy -- N/a PSA -- No results found for: "PSA1", "PSA" Diet -- Overall healthy Exercise --regular exercise Sleep habits --  No concerns  Weight --  Recent weight history Wt Readings from Last 10 Encounters:  11/07/23 (!) 367 lb (166.5 kg)  10/07/23 (!) 360 lb 8 oz (163.5 kg)  07/30/23 (!) 362 lb (164.2 kg)  10/30/22 (!) 357 lb (161.9 kg)  07/29/22 (!) 364 lb 6.4 oz (165.3 kg)  03/20/22 (!) 364 lb 8 oz (165.3 kg)  01/23/22 (!) 362 lb (164.2 kg)  12/17/21 (!) 368 lb 6.1 oz (167.1 kg)  09/18/21 (!) 365 lb 3.2 oz (165.7 kg)  09/06/21 (!) 364 lb 12.8 oz (165.5 kg)   Body mass index is 51.19 kg/m.  Mood -- Stable Alcohol use --  reports current alcohol use of about 3.0 standard drinks of alcohol per week.  Tobacco use --  Tobacco Use: Low Risk  (11/07/2023)   Patient History    Smoking Tobacco Use: Never    Smokeless Tobacco Use: Never    Passive Exposure: Not on file    Eligible for Low Dose CT? no  UTD with eye doctor? utd UTD with dentist? utd     11/07/2023    1:19 PM  Depression screen PHQ 2/9  Decreased Interest 0  Down, Depressed, Hopeless  0  PHQ - 2 Score 0  Altered sleeping 0  Tired, decreased energy 1  Change in appetite 1  Feeling bad or failure about yourself  0  Trouble concentrating 0  Moving slowly or fidgety/restless 0  Suicidal thoughts 0  PHQ-9 Score 2  Difficult doing work/chores Not difficult at all    Other providers/specialists: Patient Care Team: Alexander Iba, [place] as PCP - General (Physician Assistant)    PMHx, SurgHx, SocialHx, Medications, and Allergies were reviewed in the Visit Navigator and updated as appropriate.   Past Medical History:  Diagnosis Date   Allergy Jan 20, 1993   Penicilin   GERD (gastroesophageal reflux disease) 12/15/2020   HLD (hyperlipidemia) 08/22/2020   Hypertension 08/22/2020   Rectus diastasis 12/15/2020   Sleep apnea     No past surgical history on file.   Family History  Problem Relation Age of Onset   ADD / ADHD Mother    Anxiety disorder Mother    Depression Mother    Post-traumatic stress disorder Mother    Hypertension Father    Sleep apnea Father    ADD / ADHD Sister    Colon cancer Maternal Grandfather 58   Prostate cancer Neg Hx    Breast cancer Neg Hx    Stroke Neg Hx    Heart attack Neg Hx  Social History   Tobacco Use   Smoking status: Never   Smokeless tobacco: Never   Tobacco comments:    A cigar on special occasions, but no cigs/vape  Vaping Use   Vaping status: Never Used  Substance Use Topics   Alcohol use: Yes    Alcohol/week: 3.0 standard drinks of alcohol    Types: 3 Standard drinks or equivalent per week    Comment: Only drink on Saturday during a normal week   Drug use: Never    Review of Systems:   Review of Systems  Constitutional:  Negative for chills, fever, malaise/fatigue and weight loss.  HENT:  Negative for hearing loss, sinus pain and sore throat.   Respiratory:  Negative for cough and hemoptysis.   Cardiovascular:  Negative for chest pain, palpitations, leg swelling and PND.  Gastrointestinal:   Negative for abdominal pain, constipation, diarrhea, heartburn, nausea and vomiting.  Genitourinary:  Negative for dysuria, frequency and urgency.  Musculoskeletal:  Negative for back pain, myalgias and neck pain.  Skin:  Negative for itching and rash.  Neurological:  Negative for dizziness, tingling, seizures and headaches.  Endo/Heme/Allergies:  Negative for polydipsia.  Psychiatric/Behavioral:  Negative for depression. The patient is not nervous/anxious.       Objective:    Vitals:   11/07/23 1302 11/07/23 1322  BP: (!) 146/90 (!) 140/90  Pulse: 82   Temp: (!) 97.3 F (36.3 C)   SpO2: 99%     Body mass index is 51.19 kg/m.  General  Alert, cooperative, no distress, appears stated age  Head:  Normocephalic, without obvious abnormality, atraumatic  Eyes:  PERRL, conjunctiva/corneas clear, EOM's intact, fundi benign, both eyes       Ears:  Normal TM's and external ear canals, both ears  Nose: Nares normal, septum midline, mucosa normal, no drainage or sinus tenderness  Throat: Lips, mucosa, and tongue normal; teeth and gums normal  Neck: Supple, symmetrical, trachea midline, no adenopathy;     thyroid:  No enlargement/tenderness/nodules; no carotid bruit or JVD  Back:   Symmetric, no curvature, ROM normal, no CVA tenderness  Lungs:   Clear to auscultation bilaterally, respirations unlabored  Chest wall:  No tenderness or deformity  Heart:  Regular rate and rhythm, S1 and S2 normal, no murmur, rub or gallop  Abdomen:   Soft, non-tender, bowel sounds active all four quadrants, no masses, no organomegaly  Extremities: Extremities normal, atraumatic, no cyanosis or edema  Prostate : Deferred   Skin: Skin color, texture, turgor normal, no rashes or lesions  Lymph nodes: Cervical, supraclavicular, and axillary nodes normal  Neurologic: CNII-XII grossly intact. Normal strength, sensation and reflexes throughout   AssessmentPlan:   Routine physical examination Today patient  counseled on age appropriate routine health concerns for screening and prevention, each reviewed and up to date or declined. Immunizations reviewed and up to date or declined. Labs ordered and reviewed. Risk factors for depression reviewed and negative. Hearing function and visual acuity are intact. ADLs screened and addressed as needed. Functional ability and level of safety reviewed and appropriate. Education, counseling and referrals performed based on assessed risks today. Patient provided with a copy of personalized plan for preventive services.  Primary hypertension Above goal today No evidence of end-organ damage on my exam Recommend patient monitor home blood pressure at least a few times weekly Continue Amlodipine   10 mg daily, Chlorthalidone   25 mg daily, Losartan   100 mg daily, and Toprol -XL 25 mg daily If home monitoring shows  consistent elevation, or any symptom(s) develop, recommend reach out to us  for further advice on next steps He has an appointment with cardiology to establish at the advanced hypertension clinic in 2 months  Insulin resistance We updated blood work at last visit Remains stable  continue to monitor  Obesity, unspecified class, unspecified obesity type, unspecified whether serious comorbidity present Continue efforts at healthy lifestyle  Depression, recurrent (HCC) Well-controlled Continue Wellbutrin  300 mg extended release daily  Hyperlipidemia, unspecified hyperlipidemia type Most recent lipid panel reviewed He does not qualify for statin at this time Continue efforts at reduction with healthy lifestyle   I, Bernita Bristle, acting as a Neurosurgeon for Alexander Iba, [place]., have documented all relevant documentation on the behalf of Alexander Iba, [place], as directed by   while in the presence of Alexander Iba, [place].  I, Alexander Iba, [place], have reviewed all documentation for this visit. The documentation on 11/07/23 for the exam, diagnosis, procedures, and  orders are all accurate and complete.  Alexander Iba, PA-C [employer] [facility]

## 2023-11-07 ENCOUNTER — Ambulatory Visit (INDEPENDENT_AMBULATORY_CARE_PROVIDER_SITE_OTHER): Admitting: Physician Assistant

## 2023-11-07 ENCOUNTER — Encounter: Payer: Self-pay | Admitting: Physician Assistant

## 2023-11-07 VITALS — BP 140/90 | HR 82 | Temp 97.3°F | Ht 71.0 in | Wt 367.0 lb

## 2023-11-07 DIAGNOSIS — E669 Obesity, unspecified: Secondary | ICD-10-CM

## 2023-11-07 DIAGNOSIS — F339 Major depressive disorder, recurrent, unspecified: Secondary | ICD-10-CM

## 2023-11-07 DIAGNOSIS — E88819 Insulin resistance, unspecified: Secondary | ICD-10-CM | POA: Diagnosis not present

## 2023-11-07 DIAGNOSIS — Z0001 Encounter for general adult medical examination with abnormal findings: Secondary | ICD-10-CM

## 2023-11-07 DIAGNOSIS — E785 Hyperlipidemia, unspecified: Secondary | ICD-10-CM

## 2023-11-07 DIAGNOSIS — I10 Essential (primary) hypertension: Secondary | ICD-10-CM | POA: Diagnosis not present

## 2023-11-07 DIAGNOSIS — Z0000 Encounter for general adult medical examination without abnormal findings: Secondary | ICD-10-CM

## 2023-11-07 LAB — BASIC METABOLIC PANEL WITH GFR
BUN: 9 mg/dL (ref 6–23)
CO2: 29 meq/L (ref 19–32)
Calcium: 9.8 mg/dL (ref 8.4–10.5)
Chloride: 99 meq/L (ref 96–112)
Creatinine, Ser: 0.84 mg/dL (ref 0.40–1.50)
GFR: 116.91 mL/min (ref >60.00)
Glucose, Bld: 148 mg/dL — ABNORMAL HIGH (ref 70–99)
Potassium: 3.2 meq/L — ABNORMAL LOW (ref 3.5–5.1)
Sodium: 138 meq/L (ref 135–145)

## 2023-11-10 ENCOUNTER — Ambulatory Visit: Payer: Self-pay | Admitting: Physician Assistant

## 2023-11-15 ENCOUNTER — Other Ambulatory Visit: Payer: Self-pay | Admitting: Physician Assistant

## 2023-12-18 ENCOUNTER — Encounter: Payer: Self-pay | Admitting: Physician Assistant

## 2023-12-19 ENCOUNTER — Ambulatory Visit: Admitting: Physician Assistant

## 2023-12-19 ENCOUNTER — Encounter: Payer: Self-pay | Admitting: Physician Assistant

## 2023-12-19 VITALS — BP 138/90 | HR 83 | Temp 98.2°F | Ht 71.0 in | Wt 361.4 lb

## 2023-12-19 DIAGNOSIS — I10 Essential (primary) hypertension: Secondary | ICD-10-CM

## 2023-12-19 DIAGNOSIS — R82998 Other abnormal findings in urine: Secondary | ICD-10-CM

## 2023-12-19 LAB — POCT URINALYSIS DIPSTICK
Bilirubin, UA: NEGATIVE
Blood, UA: NEGATIVE
Glucose, UA: NEGATIVE
Ketones, UA: NEGATIVE
Leukocytes, UA: NEGATIVE
Nitrite, UA: NEGATIVE
Protein, UA: NEGATIVE
Spec Grav, UA: 1.015 (ref 1.010–1.025)
Urobilinogen, UA: 0.2 U/dL
pH, UA: 6.5 (ref 5.0–8.0)

## 2023-12-19 NOTE — Progress Notes (Signed)
 Barry Walker is a 33 y.o. male here for a new problem.  History of Present Illness:   Chief Complaint  Patient presents with   urine problem    Pt c/o having foamy urine, happened several times.    HPI  Urine Issue Patient complains today of occasionally experiencing bubbles/foamy urine  Urine analysis is normal today. Denies any back pain.   Elevated BP  BP is 140/86 Currently taking: amlodipine  10 mg daily, chlorthalidone  25 mg daily, losartan  100 mg daily At home BP has been running around recommended averages.  Patient denies chest pain, SOB, blurred vision, dizziness, unusual headaches, lower leg swelling. Patient is compliant with medication. Denies excessive caffeine intake, stimulant usage, excessive alcohol intake, or increase in salt.   Past Medical History:  Diagnosis Date   Allergy 1992/06/12   Penicilin   GERD (gastroesophageal reflux disease) 12/15/2020   HLD (hyperlipidemia) 08/22/2020   Hypertension 08/22/2020   Rectus diastasis 12/15/2020   Sleep apnea      Social History   Tobacco Use   Smoking status: Never   Smokeless tobacco: Never   Tobacco comments:    A cigar on special occasions, but no cigs/vape  Vaping Use   Vaping status: Never Used  Substance Use Topics   Alcohol use: Yes    Alcohol/week: 3.0 standard drinks of alcohol    Types: 3 Standard drinks or equivalent per week    Comment: Only drink on Saturday during a normal week   Drug use: Never    No past surgical history on file.  Family History  Problem Relation Age of Onset   ADD / ADHD Mother    Anxiety disorder Mother    Depression Mother    Post-traumatic stress disorder Mother    Hypertension Father    Sleep apnea Father    ADD / ADHD Sister    Colon cancer Maternal Grandfather 20   Prostate cancer Neg Hx    Breast cancer Neg Hx    Stroke Neg Hx    Heart attack Neg Hx     Allergies  Allergen Reactions   Penicillins     Reaction = unknown, was told as a child  he had a reaction to it.     Current Medications:   Current Outpatient Medications:    acetaminophen  (TYLENOL ) 500 MG tablet, Take 500 mg by mouth every 6 (six) hours as needed., Disp: , Rfl:    amLODipine  (NORVASC ) 10 MG tablet, TAKE 1 TABLET BY MOUTH EVERY DAY, Disp: 90 tablet, Rfl: 3   buPROPion  (WELLBUTRIN  XL) 300 MG 24 hr tablet, TAKE 1 TABLET BY MOUTH EVERY DAY, Disp: 90 tablet, Rfl: 3   chlorthalidone  (HYGROTON ) 25 MG tablet, Take 1 tablet (25 mg total) by mouth daily., Disp: 90 tablet, Rfl: 3   losartan  (COZAAR ) 100 MG tablet, TAKE 1 TABLET BY MOUTH EVERY DAY, Disp: 90 tablet, Rfl: 3   metoprolol  succinate (TOPROL -XL) 25 MG 24 hr tablet, TAKE 1 TABLET (25 MG TOTAL) BY MOUTH DAILY., Disp: 90 tablet, Rfl: 3   Multiple Vitamin (MULTIVITAMIN) tablet, Take 1 tablet by mouth daily., Disp: , Rfl:    Omega-3 Fatty Acids (FISH OIL PO), Take 1 capsule by mouth daily in the afternoon., Disp: , Rfl:    potassium chloride  SA (KLOR-CON  M) 20 MEQ tablet, TAKE 1 TABLET BY MOUTH EVERY DAY, Disp: 90 tablet, Rfl: 1   Review of Systems:   Review of Systems  Musculoskeletal:  Negative for back pain.  Negative  unless otherwise specified per HPI.  Vitals:   Vitals:   12/19/23 1124 12/19/23 1144  BP: (!) 140/86 (!) 138/90  Pulse: 83   Temp: 98.2 F (36.8 C)   TempSrc: Temporal   SpO2: 96%   Weight: (!) 361 lb 6.1 oz (163.9 kg)   Height: 5' 11 (1.803 m)      Body mass index is 50.4 kg/m.  Physical Exam:   Physical Exam Vitals and nursing note reviewed.  Constitutional:      Appearance: He is well-developed.  HENT:     Head: Normocephalic.  Eyes:     Conjunctiva/sclera: Conjunctivae normal.     Pupils: Pupils are equal, round, and reactive to light.  Pulmonary:     Effort: Pulmonary effort is normal.  Musculoskeletal:        General: Normal range of motion.     Cervical back: Normal range of motion.  Skin:    General: Skin is warm and dry.  Neurological:     Mental Status: He  is alert and oriented to person, place, and time.  Psychiatric:        Behavior: Behavior normal.        Thought Content: Thought content normal.        Judgment: Judgment normal.    Results for orders placed or performed in visit on 12/19/23  POCT urinalysis dipstick  Result Value Ref Range   Color, UA Yellow    Clarity, UA Clear    Glucose, UA Negative Negative   Bilirubin, UA Negative    Ketones, UA Negative    Spec Grav, UA 1.015 1.010 - 1.025   Blood, UA Negative    pH, UA 6.5 5.0 - 8.0   Protein, UA Negative Negative   Urobilinogen, UA 0.2 0.2 or 1.0 E.U./dL   Nitrite, UA Negative    Leukocytes, UA Negative Negative   Appearance     Odor       Assessment and Plan:   Foamy urine Urinalysis in unremarkable Symptom(s) are also not of major concern Last BMP was normal -- offered to recheck today, but he declined If new symptom(s) arise, recommend reaching out  Primary hypertension Above goal today No evidence of end-organ damage on my exam Recommend patient monitor home blood pressure at least a few times weekly Continue  amlodipine  10 mg daily, chlorthalidone  25 mg daily, losartan  100 mg daily If home monitoring shows consistent elevation, or any symptom(s) develop, recommend reach out to us  for further advice on next steps   Lucie Buttner, PA-C  I,Safa M Kadhim,acting as a scribe for [employer], PA.,have documented all relevant documentation on the behalf of Lucie Buttner, PA,as directed by  Lucie Buttner, PA while in the presence of Lucie Buttner, [place].   I, Lucie Buttner, [place], have reviewed all documentation for this visit. The documentation on 12/19/23 for the exam, diagnosis, procedures, and orders are all accurate and complete.

## 2023-12-21 ENCOUNTER — Other Ambulatory Visit: Payer: Self-pay | Admitting: Physician Assistant

## 2024-01-22 ENCOUNTER — Encounter (HOSPITAL_BASED_OUTPATIENT_CLINIC_OR_DEPARTMENT_OTHER): Payer: Self-pay | Admitting: Family

## 2024-01-22 ENCOUNTER — Encounter (HOSPITAL_BASED_OUTPATIENT_CLINIC_OR_DEPARTMENT_OTHER): Payer: Self-pay

## 2024-01-22 ENCOUNTER — Ambulatory Visit (HOSPITAL_BASED_OUTPATIENT_CLINIC_OR_DEPARTMENT_OTHER): Admitting: Family

## 2024-01-22 VITALS — BP 141/95 | HR 72 | Ht 71.0 in | Wt 360.0 lb

## 2024-01-22 DIAGNOSIS — E782 Mixed hyperlipidemia: Secondary | ICD-10-CM

## 2024-01-22 DIAGNOSIS — I1A0 Resistant hypertension: Secondary | ICD-10-CM

## 2024-01-22 DIAGNOSIS — Z5181 Encounter for therapeutic drug level monitoring: Secondary | ICD-10-CM

## 2024-01-22 DIAGNOSIS — G4733 Obstructive sleep apnea (adult) (pediatric): Secondary | ICD-10-CM

## 2024-01-22 MED ORDER — AMLODIPINE-OLMESARTAN 10-40 MG PO TABS: 1.0000 | ORAL_TABLET | Freq: Every day | ORAL | 3 refills | Status: AC

## 2024-01-22 NOTE — Progress Notes (Signed)
 [facility] Initial Assessment:    Date:  01/22/2024   ID:  Barry Walker, DOB 21-Sep-1990, MRN [id number]  PCP:  Job Lukes, PA  Cardiologist:  None  Nephrologist:  Referring MD: Job Lukes, PA   CC: Hypertension  History of Present Illness:    Discussed the use of AI scribe software for clinical note transcription with the patient, who gave verbal consent to proceed.  History of Present Illness Barry Walker is a 33 year old male with hypertension who presents for a hypertension clinic visit. Referred by primary care for hypertension management.  Hypertension was diagnosed in 2022. Home blood pressure readings range from 125/75 to 135/83 mmHg, occasionally as low as 117/80 mmHg without symptoms. He is on amlodipine  10 mg, chlorthalidone  25 mg, losartan  100 mg, and metoprolol  succinate 25mg  daily. Home readings are generally lower than office readings. He experienced increased urination with hydrochlorothiazide but not with Chlorthalidone .  Family history includes hypertension in his father, with no heart attacks or strokes.  He has experienced chest sensations leading to three ER visits, not associated with exertion and not diagnosed as cardiac events. No consistent chest pain during physical activity.  He follows a low sodium diet, cooks at home, and exercises regularly by walking 4-5 times a week for 45 minutes to an hour. He reports gradual weight loss. He takes a potassium supplement for low potassium levels. No ultrasound of kidney arteries has been performed. He wears CPAP regularly for severe OSA, managed by neurology.    Previous antihypertensives: Hydrochlorothiazide - changed to Chlorthalidone   Past Medical History:  Diagnosis Date   Allergy June 30, 1992   Penicilin   GERD (gastroesophageal reflux disease) 12/15/2020   HLD (hyperlipidemia) 08/22/2020   Hypertension 08/22/2020   Rectus diastasis 12/15/2020   Sleep apnea     History  reviewed. No pertinent surgical history.  Current Medications: Current Meds  Medication Sig   acetaminophen  (TYLENOL ) 500 MG tablet Take 500 mg by mouth every 6 (six) hours as needed.   amLODipine  (NORVASC ) 10 MG tablet TAKE 1 TABLET BY MOUTH EVERY DAY   buPROPion  (WELLBUTRIN  XL) 300 MG 24 hr tablet TAKE 1 TABLET BY MOUTH EVERY DAY   chlorthalidone  (HYGROTON ) 25 MG tablet TAKE 1 TABLET (25 MG TOTAL) BY MOUTH DAILY.   losartan  (COZAAR ) 100 MG tablet TAKE 1 TABLET BY MOUTH EVERY DAY   metoprolol  succinate (TOPROL -XL) 25 MG 24 hr tablet TAKE 1 TABLET (25 MG TOTAL) BY MOUTH DAILY.   Multiple Vitamin (MULTIVITAMIN) tablet Take 1 tablet by mouth daily.   Omega-3 Fatty Acids (FISH OIL PO) Take 1 capsule by mouth daily in the afternoon.   potassium chloride  SA (KLOR-CON  M) 20 MEQ tablet TAKE 1 TABLET BY MOUTH EVERY DAY     Allergies:   Penicillins   Social History   Socioeconomic History   Marital status: Single    Spouse name: Not on file   Number of children: Not on file   Years of education: Not on file   Highest education level: Bachelor's degree (e.g., BA, AB, BS)  Occupational History   Not on file  Tobacco Use   Smoking status: Never   Smokeless tobacco: Never   Tobacco comments:    A cigar on special occasions, but no cigs/vape  Vaping Use   Vaping status: Never Used  Substance and Sexual Activity   Alcohol use: Yes    Alcohol/week: 3.0 standard drinks of alcohol    Types: 3 Standard drinks or equivalent  per week    Comment: Only drink on Saturday during a normal week   Drug use: Never   Sexual activity: Not Currently    Birth control/protection: Condom  Other Topics Concern   Not on file  Social History Narrative   Dietitian for [employer]   Grew up in [place]   Has been in [place] since sept 2018.     Caffeine water, sugar free lemonade.    Social Drivers of Corporate investment banker Strain: Low Risk  (12/18/2023)   Overall Financial Resource Strain (CARDIA)     Difficulty of Paying Living Expenses: Not very hard  Food Insecurity: No Food Insecurity (12/18/2023)   Hunger Vital Sign    Worried About Running Out of Food in the Last Year: Never true    Ran Out of Food in the Last Year: Never true  Transportation Needs: No Transportation Needs (12/18/2023)   PRAPARE - Administrator, Civil Service (Medical): No    Lack of Transportation (Non-Medical): No  Physical Activity: Sufficiently Active (12/18/2023)   Exercise Vital Sign    Days of Exercise per Week: 4 days    Minutes of Exercise per Session: 40 min  Stress: No Stress Concern Present (12/18/2023)   [employer] of Occupational Health - Occupational Stress Questionnaire    Feeling of Stress: Only a little  Recent Concern: Stress - Stress Concern Present (10/06/2023)   [employer] of Occupational Health - Occupational Stress Questionnaire    Feeling of Stress : To some extent  Social Connections: Socially Isolated (12/18/2023)   Social Connection and Isolation Panel    Frequency of Communication with Friends and Family: Once a week    Frequency of Social Gatherings with Friends and Family: Once a week    Attends Religious Services: Never    Database administrator or Organizations: No    Attends Engineer, structural: Not on file    Marital Status: Never married     Family History: The patient's family history includes ADD / ADHD in his mother and sister; Anxiety disorder in his mother; Colon cancer (age of onset: 77) in his maternal grandfather; Depression in his mother; Hypertension in his father; Post-traumatic stress disorder in his mother; Sleep apnea in his father. There is no history of Prostate cancer, Breast cancer, Stroke, or Heart attack.  ROS:   Please see the history of present illness.     All other systems reviewed and are negative.  EKGs/Labs/Other Studies Reviewed:    EKG Interpretation Date/Time:  Thursday January 22 2024 11:15:43  EDT Ventricular Rate:  73 PR Interval:  160 QRS Duration:  90 QT Interval:  390 QTC Calculation: 429 R Axis:   71  Text Interpretation: Normal sinus rhythm with sinus arrhythmia Normal ECG Confirmed by Vannie Mora ([id number]) on 01/22/2024 11:21:09 AM    Recent Labs: 10/07/2023: ALT 29; Hemoglobin 16.3; Platelets 336.0; TSH 1.35 11/07/2023: BUN 9; Creatinine, Ser 0.84; Potassium 3.2; Sodium 138   Recent Lipid Panel    Component Value Date/Time   CHOL 232 (H) 10/07/2023 1205   TRIG 261.0 (H) 10/07/2023 1205   HDL 34.70 (L) 10/07/2023 1205   CHOLHDL 7 10/07/2023 1205   VLDL 52.2 (H) 10/07/2023 1205   LDLCALC 145 (H) 10/07/2023 1205   LDLDIRECT 137.0 10/30/2022 0842    Physical Exam:   VS:  BP (!) 141/95   Pulse 72   Ht 5' 11 (1.803 m)   [place] ROLLEN)  360 lb (163.3 kg)   SpO2 98%   BMI 50.21 kg/m  , BMI Body mass index is 50.21 kg/m. GENERAL:  Well appearing HEENT: Pupils equal round and reactive, fundi not visualized, oral mucosa unremarkable NECK:  No jugular venous distention, waveform within normal limits, carotid upstroke brisk and symmetric, no bruits, no thyromegaly LYMPHATICS:  No cervical adenopathy LUNGS:  Clear to auscultation bilaterally HEART:  RRR.  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 On amlodipine , chlorthalidone , losartan , metoprolol . No cardiac events. Uses CPAP. Potential  secondary causes: renal artery stenosis, hyperaldosteronism. Losartan  may not provide full coverage. - Order renal artery ultrasound for stenosis evaluation. - Order renin-aldosterone ratio for hyperaldosteronism assessment. - Stop Losartan , Amlodipine . Start Amlodipine -Olmesartan  10-40mg  daily. BMET in 1 week.  - Continue Chlorthalidone  57m gdaily, Toprol  25mg  daily.  - Consider discontinuing Toprol  if controlled on new regimen. - Recheck blood pressure in one week, follow up via MyChart.  Hyperlipidemia Chronic hyperlipidemia, potential genetic component. No family history of early cardiac events. Regular exercise and healthy diet. - Order lipoprotein A test for genetic evaluation.  OSA CPAP compliance encouraged. Endorses using regularly.   Hypokalemia Hypokalemia managed with potassium 20mEq daily. Chlorthalidone  may contribute. Potential hyperaldosteronism factor. - If renin-aldosterone ratio indicates hyperaldosteronism, switch chlorthalidone  to spironolactone. - If potassium persistently low, consider switching chlorthalidone  to spironolactone.  Overweight / BMI 50 Overweight with gradual weight loss.through regular physical activity, healthy diet. Prefers oral GLP-1 agonists due to needle aversion. - Monitor weight, continue exercise and diet. - Consider GLP-1 agonists if oral formulations available.    Screening for Secondary Hypertension:     01/22/2024   10:20 AM  Causes  Sleep Apnea Screened     - Comments Treated with CPAP  Thyroid Disease Screened     - Comments 10/2023 normal TSH  Pheochromocytoma N/A     - Comments CT 2023 normal adrenals    Relevant Labs/Studies:    Latest Ref Rng & Units 11/07/2023    1:22 PM 10/23/2023   11:17 AM 10/07/2023   12:05 PM  Basic Labs  Sodium 135 - 145 mEq/L 138  135  137   Potassium 3.5 - 5.1 mEq/L 3.2  3.1  3.1   Creatinine 0.40 - 1.50 mg/dL 9.15  9.09  9.06        Latest Ref Rng & Units 10/07/2023   12:05 PM 06/18/2021    11:04 AM  Thyroid   TSH 0.35 - 5.50 uIU/mL 1.35  2.34                   Disposition:    FU with MD/APP/PharmD in 2-3 months    Medication Adjustments/Labs and Tests Ordered: Current medicines are reviewed at length with the patient today.  Concerns regarding medicines are outlined above.  Orders Placed This Encounter  Procedures   EKG 12-Lead   No orders of the defined types were placed in this  encounter.    Signed, Reche GORMAN Finder, NP  01/22/2024 11:27 AM    [facility]

## 2024-01-22 NOTE — Patient Instructions (Signed)
 Medication Instructions:   DISCONTINUE Losartan   DISCONTINUE Amlodipine   START Amlodipine -olmesartan  one (1) tablet by mouth ( 10-40 mg) daily.   *If you need a refill on your cardiac medications before your next appointment, please call your pharmacy*  Lab Work:  Your physician recommends that you return for a FASTING LPA/BMET/CORTISOL/BMET, Fasting after midnight. Paper work given to patient today.    If you have labs (blood work) drawn today and your tests are completely normal, you will receive your results only by: MyChart Message (if you have MyChart) OR A paper copy in the mail If you have any lab test that is abnormal or we need to change your treatment, we will call you to review the results.  Testing/Procedures:  Your physician has requested that you have a renal artery duplex. During this test, an ultrasound is used to evaluate blood flow to the kidneys. Allow one hour for this exam. Do not eat after midnight the day before and avoid carbonated beverages. Take your medications as you usually do.   Follow-Up: At [facility], you and your health needs are our priority.  As part of our continuing mission to provide you with exceptional heart care, our providers are all part of one team.  This team includes your primary Cardiologist (physician) and Advanced Practice Providers or APPs (Physician Assistants and Nurse Practitioners) who all work together to provide you with the care you need, when you need it.  Your next appointment:   3 month(s)  Provider:   Reche Finder, NP    We recommend signing up for the patient portal called MyChart.  Sign up information is provided on this After Visit Summary.  MyChart is used to connect with patients for Virtual Visits (Telemedicine).  Patients are able to view lab/test results, encounter notes, upcoming appointments, etc.  Non-urgent messages can be sent to your provider as well.   To learn more about what you can do  with MyChart, go to [email or web address].   Other Instructions      Tips to Measure your Blood Pressure Correctly  Here's what you can do to ensure a correct reading:  Don't drink a caffeinated beverage or smoke during the 30 minutes before the test.  Sit quietly for five minutes before the test begins.  During the measurement, sit in a chair with your feet on the floor and your arm supported so your elbow is at about heart level.  The inflatable part of the cuff should completely cover at least 80% of your upper arm, and the cuff should be placed on bare skin, not over a shirt.  Don't talk during the measurement.  Have your blood pressure measured twice, with a brief break in between. If the readings are different by 5 points or more, have it done a third time.  In 2017, new guidelines from the American Heart Association, the [employer] of Cardiology, and nine other health organizations lowered the diagnosis of high blood pressure to 130/80 mm Hg or higher for all adults. The guidelines also redefined the various blood pressure categories to now include normal, elevated, Stage 1 hypertension, Stage 2 hypertension, and hypertensive crisis (see Blood pressure categories).  Blood pressure categories  Blood pressure category SYSTOLIC (upper number)  DIASTOLIC (lower number)  Normal Less than 120 mm Hg and Less than 80 mm Hg  Elevated 120-129 mm Hg and Less than 80 mm Hg  High blood pressure: Stage 1 hypertension 130-139 mm Hg or 80-89 mm Hg  High blood pressure: Stage 2 hypertension 140 mm Hg or higher or 90 mm Hg or higher  Hypertensive crisis (consult your doctor immediately) Higher than 180 mm Hg and/or Higher than 120 mm Hg  Source: American Heart Association and American Stroke Association. For more on getting your blood pressure under control, buy Controlling Your Blood Pressure, a Special Health Report from [facility].

## 2024-01-29 MED ORDER — ROSUVASTATIN CALCIUM 10 MG PO TABS: 10.0000 mg | ORAL_TABLET | Freq: Every day | ORAL | 3 refills | Status: AC

## 2024-02-05 ENCOUNTER — Ambulatory Visit (HOSPITAL_BASED_OUTPATIENT_CLINIC_OR_DEPARTMENT_OTHER): Payer: Self-pay | Admitting: Family

## 2024-02-05 LAB — ALDOSTERONE + RENIN ACTIVITY W/ RATIO
Aldos/Renin Ratio: 0.3 (ref 0.0–30.0)
Aldosterone: 6.5 ng/dL (ref 0.0–30.0)
Renin Activity, Plasma: 21.665 ng/mL/h — ABNORMAL HIGH (ref 0.167–5.380)

## 2024-02-05 LAB — BASIC METABOLIC PANEL WITH GFR
BUN/Creatinine Ratio: 13 (ref 9–20)
BUN: 11 mg/dL (ref 6–20)
CO2: 22 mmol/L (ref 20–29)
Calcium: 9.7 mg/dL (ref 8.7–10.2)
Chloride: 98 mmol/L (ref 96–106)
Creatinine, Ser: 0.84 mg/dL (ref 0.76–1.27)
Glucose: 98 mg/dL (ref 70–99)
Potassium: 3.9 mmol/L (ref 3.5–5.2)
Sodium: 140 mmol/L (ref 134–144)
eGFR: 120 mL/min/1.73 (ref >59)

## 2024-02-05 LAB — LIPOPROTEIN A (LPA): Lipoprotein (a): 233 nmol/L — ABNORMAL HIGH (ref <75.0)

## 2024-02-05 LAB — CORTISOL: Cortisol: 12.7 ug/dL (ref 6.2–19.4)

## 2024-02-18 ENCOUNTER — Ambulatory Visit (INDEPENDENT_AMBULATORY_CARE_PROVIDER_SITE_OTHER)

## 2024-02-18 DIAGNOSIS — I10 Essential (primary) hypertension: Secondary | ICD-10-CM

## 2024-02-18 DIAGNOSIS — I1A0 Resistant hypertension: Secondary | ICD-10-CM

## 2024-04-20 NOTE — Progress Notes (Signed)
 [facility] Assessment:    Date:  04/27/2024   ID:  Barry Walker, DOB 03-07-1991, MRN [id number]  PCP:  Job Lukes, PA  Cardiologist:  None  Nephrologist:  Referring MD: Job Lukes, PA   CC: Hypertension  History of Present Illness:    Discussed the use of AI scribe software for clinical note transcription with the patient, who gave verbal consent to proceed.  History of Present Illness Barry Walker is a 33 year old male with a history of resistant hypertension, hyperlipidemia, OSA, hypokalemia, obesity.  Established with [facility] 01/22/24. Initial diagnosis of hypertension in 2022. BP at home 125/75-135/83 mmHg on regimen amlodipine  10 mg, chlorthalidone  25 mg, losartan  100 mg, and metoprolol  succinate 25mg  daily. Home readings are generally lower than office readings. He experienced increased urination with hydrochlorothiazide but not with Chlorthalidone .  Family history includes hypertension in his father, with no heart attacks or strokes noted in family history.  Renin aldosterone ratio with no evidence of hyperaldosteronism.  Cortisol level normal.  His LPA was noted to be elevated at 233 indicating familial hyperlipidemia.  Rosuvastatin  10 mg daily initiated.  For simplification of medication regimen losartan  amlodipine  stopped.  He was started on amlodipine -olmesartan  10 to 40 mg daily.  Chlorthalidone  25 mg daily, Toprol  25 mg daily were continued.  He declined consideration of GLP-1 due to needle aversion.   Based on follow up BP improved on Amlodipine -Olmesartan , his Toprol  was able to be discontinued.   Renal artery duplex 02/18/2024 with no right renal artery stenosis.  Left renal artery not well-visualized.  Presents today for follow up.  Blood pressure readings at home are consistent with office measurements, typically around 119/73 or 112/77, without associated lightheadedness or dizziness. He has discontinued  metoprolol , experiencing only a slight increase in heart rate, which remains in the 80s at rest but no palpitations.  Weight management is ongoing despite a demanding work schedule. He has lost weight, decreasing from 360 pounds in August to 357 pounds currently, by maintaining home-cooked meals and incorporating short walking sessions.  Dietary changes include feeling less satiated and snacking more often, with consistent protein intake and the addition of egg whites to breakfast. His typical breakfast includes eggs, egg whites, a smoothie with frozen berries, and nonfat vanilla yogurt. Snacks include granola bars or nuts.  Recent blood work shows significant improvement in cholesterol levels, with LDL reduced from 145 to 87 and triglycerides from 261 to 173.   Previous antihypertensives: Hydrochlorothiazide - changed to Chlorthalidone   Past Medical History:  Diagnosis Date   Allergy 11-27-1992   Penicilin   GERD (gastroesophageal reflux disease) 12/15/2020   HLD (hyperlipidemia) 08/22/2020   Hypertension 08/22/2020   Rectus diastasis 12/15/2020   Sleep apnea     History reviewed. No pertinent surgical history.  Current Medications: Current Meds  Medication Sig   acetaminophen  (TYLENOL ) 500 MG tablet Take 500 mg by mouth every 6 (six) hours as needed.   amLODipine -olmesartan  (AZOR ) 10-40 MG tablet Take 1 tablet by mouth daily.   buPROPion  (WELLBUTRIN  XL) 300 MG 24 hr tablet TAKE 1 TABLET BY MOUTH EVERY DAY   chlorthalidone  (HYGROTON ) 25 MG tablet TAKE 1 TABLET (25 MG TOTAL) BY MOUTH DAILY.   Multiple Vitamin (MULTIVITAMIN) tablet Take 1 tablet by mouth daily.   Omega-3 Fatty Acids (FISH OIL PO) Take 1 capsule by mouth daily in the afternoon.   potassium chloride  SA (KLOR-CON  M) 20 MEQ tablet TAKE 1 TABLET BY MOUTH EVERY DAY   rosuvastatin  (  CRESTOR ) 10 MG tablet Take 1 tablet (10 mg total) by mouth daily.     Allergies:   Penicillins   Social History   Socioeconomic History    Marital status: Single    Spouse name: Not on file   Number of children: Not on file   Years of education: Not on file   Highest education level: Bachelor's degree (e.g., BA, AB, BS)  Occupational History   Not on file  Tobacco Use   Smoking status: Never    Passive exposure: Never   Smokeless tobacco: Never   Tobacco comments:    A cigar on special occasions, but no cigs/vape  Vaping Use   Vaping status: Never Used  Substance and Sexual Activity   Alcohol use: Yes    Alcohol/week: 3.0 standard drinks of alcohol    Types: 3 Standard drinks or equivalent per week    Comment: Only drink on Saturday during a normal week   Drug use: Never   Sexual activity: Not Currently    Birth control/protection: Condom  Other Topics Concern   Not on file  Social History Narrative   Dietitian for [employer]   Grew up in [place]   Has been in [place] since sept 2018.     Caffeine water, sugar free lemonade.    Social Drivers of Corporate Investment Banker Strain: Low Risk  (12/18/2023)   Overall Financial Resource Strain (CARDIA)    Difficulty of Paying Living Expenses: Not very hard  Food Insecurity: No Food Insecurity (04/22/2024)   Hunger Vital Sign    Worried About Running Out of Food in the Last Year: Never true    Ran Out of Food in the Last Year: Never true  Transportation Needs: No Transportation Needs (12/18/2023)   PRAPARE - Administrator, Civil Service (Medical): No    Lack of Transportation (Non-Medical): No  Physical Activity: Sufficiently Active (12/18/2023)   Exercise Vital Sign    Days of Exercise per Week: 4 days    Minutes of Exercise per Session: 40 min  Stress: No Stress Concern Present (12/18/2023)   [employer] of Occupational Health - Occupational Stress Questionnaire    Feeling of Stress: Only a little  Recent Concern: Stress - Stress Concern Present (10/06/2023)   [employer] of Occupational Health - Occupational Stress Questionnaire     Feeling of Stress : To some extent  Social Connections: Socially Isolated (12/18/2023)   Social Connection and Isolation Panel    Frequency of Communication with Friends and Family: Once a week    Frequency of Social Gatherings with Friends and Family: Once a week    Attends Religious Services: Never    Database Administrator or Organizations: No    Attends Engineer, Structural: Not on file    Marital Status: Never married     Family History: The patient's family history includes ADD / ADHD in his mother and sister; Anxiety disorder in his mother; Colon cancer (age of onset: 52) in his maternal grandfather; Depression in his mother; Hypertension in his father; Post-traumatic stress disorder in his mother; Sleep apnea in his father. There is no history of Prostate cancer, Breast cancer, Stroke, or Heart attack.  ROS:   Please see the history of present illness.     All other systems reviewed and are negative.  EKGs/Labs/Other Studies Reviewed:         Recent Labs: 10/07/2023: Hemoglobin 16.3; Platelets 336.0; TSH 1.35 01/28/2024:  BUN 11; Creatinine, Ser 0.84; Potassium 3.9; Sodium 140 04/20/2024: ALT 31   Recent Lipid Panel    Component Value Date/Time   CHOL 152 04/20/2024 1205   TRIG 173 (H) 04/20/2024 1205   HDL 35 (L) 04/20/2024 1205   CHOLHDL 4.3 04/20/2024 1205   CHOLHDL 7 10/07/2023 1205   VLDL 52.2 (H) 10/07/2023 1205   LDLCALC 87 04/20/2024 1205   LDLDIRECT 137.0 10/30/2022 0842    Physical Exam:   VS:  BP 124/66 (BP Location: Right Arm, Patient Position: Sitting, Cuff Size: Large)   Pulse 87   Ht 5' 10.5 (1.791 m)   Wt (!) 357 lb (161.9 kg)   SpO2 99%   BMI 50.50 kg/m  , BMI Body mass index is 50.5 kg/m. GENERAL:  Well appearing HEENT: Pupils equal round and reactive, fundi not visualized, oral mucosa unremarkable NECK:  No jugular venous distention, waveform within normal limits, carotid upstroke brisk and symmetric, no bruits, no  thyromegaly LYMPHATICS:  No cervical adenopathy LUNGS:  Clear to auscultation bilaterally HEART:  RRR.  PMI not displaced or sustained,S1 and S2 within normal limits, no S3, no S4, no clicks, no rubs, no murmurs ABD:  Flat, positive bowel sounds normal in frequency in pitch, no bruits, no rebound, no guarding, no midline pulsatile mass, no hepatomegaly, no splenomegaly EXT:  2 plus pulses throughout, no edema, no cyanosis no clubbing SKIN:  No rashes no nodules NEURO:  Cranial nerves II through XII grossly intact, motor grossly intact throughout PSYCH:  Cognitively intact, oriented to person place and time   ASSESSMENT/PLAN:    Assessment & Plan Resistant Hypertension BP at goal <130/80. Continue Amlodipine -Olmesartan  10-40mg  daily, Chlorthalidone  25mg  daily. -Discussed to monitor BP at home at least 2 hours after medications and sitting for 5-10 minutes.  -Referred to [facility]. Recommend aiming for 150 minutes of moderate intensity activity per week and following a heart healthy diet.    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 87.   OSA CPAP compliance encouraged. Endorses using regularly.   Overweight / BMI 50 Overweight with gradual weight loss, congratulated on continued weight loss and regular physical activity, healthy diet. Prefers oral GLP-1 agonists due to needle aversion. - Monitor weight, continue exercise and diet. - Consider GLP-1 agonists if oral formulations available in the new year.      Screening for Secondary Hypertension:     01/22/2024   10:20 AM 01/22/2024    1:16 PM  Causes  Renovascular HTN  Screened     - Comments  01/12/24 renal artery duplex ordered  Sleep Apnea Screened      - Comments Treated with CPAP   Thyroid Disease Screened      - Comments 10/2023 normal TSH   Hyperaldosteronism  Screened     - Comments  01/12/24 renin-aldo ordered   Pheochromocytoma N/A      - Comments CT 2023 normal adrenals   Cushing's Syndrome  Screened     - Comments  01/12/24 cortisol ordered  Compliance  Screened     - Comments  takes medications routinely    Relevant Labs/Studies:    Latest Ref Rng & Units 01/28/2024   10:55 AM 11/07/2023    1:22 PM 10/23/2023   11:17 AM  Basic Labs  Sodium 134 - 144 mmol/L 140  138  135   Potassium 3.5 - 5.2 mmol/L 3.9  3.2  3.1  Creatinine 0.76 - 1.27 mg/dL 9.15  9.15  9.09        Latest Ref Rng & Units 10/07/2023   12:05 PM 06/18/2021   11:04 AM  Thyroid   TSH 0.35 - 5.50 uIU/mL 1.35  2.34        Latest Ref Rng & Units 01/28/2024   10:55 AM  Renin/Aldosterone   Aldosterone 0.0 - 30.0 ng/dL 6.5   Aldos/Renin Ratio 0.0 - 30.0 0.3              02/18/2024   11:42 AM  Renovascular   Renal Artery US  Completed Yes      Disposition:    FU with MD/APP/PharmD in 6 months    Medication Adjustments/Labs and Tests Ordered: Current medicines are reviewed at length with the patient today.  Concerns regarding medicines are outlined above.  Orders Placed This Encounter  Procedures   Ambulatory referral to [facility]   No orders of the defined types were placed in this encounter.    Signed, Reche GORMAN Finder, NP  04/27/2024 1:37 PM    [facility]

## 2024-04-21 LAB — LIPID PANEL
Chol/HDL Ratio: 4.3 ratio (ref 0.0–5.0)
Cholesterol, Total: 152 mg/dL (ref 100–199)
HDL: 35 mg/dL — ABNORMAL LOW (ref >39)
LDL Chol Calc (NIH): 87 mg/dL (ref 0–99)
Triglycerides: 173 mg/dL — ABNORMAL HIGH (ref 0–149)
VLDL Cholesterol Cal: 30 mg/dL (ref 5–40)

## 2024-04-21 LAB — ALT: ALT: 31 IU/L (ref 0–44)

## 2024-04-22 ENCOUNTER — Ambulatory Visit (HOSPITAL_BASED_OUTPATIENT_CLINIC_OR_DEPARTMENT_OTHER): Admitting: Family

## 2024-04-22 ENCOUNTER — Ambulatory Visit (HOSPITAL_BASED_OUTPATIENT_CLINIC_OR_DEPARTMENT_OTHER): Payer: Self-pay | Admitting: Family

## 2024-04-22 ENCOUNTER — Encounter (HOSPITAL_BASED_OUTPATIENT_CLINIC_OR_DEPARTMENT_OTHER): Payer: Self-pay | Admitting: Family

## 2024-04-22 VITALS — BP 124/66 | HR 87 | Ht 70.5 in | Wt 357.0 lb

## 2024-04-22 DIAGNOSIS — G4733 Obstructive sleep apnea (adult) (pediatric): Secondary | ICD-10-CM

## 2024-04-22 DIAGNOSIS — I10 Essential (primary) hypertension: Secondary | ICD-10-CM

## 2024-04-22 DIAGNOSIS — E7849 Other hyperlipidemia: Secondary | ICD-10-CM

## 2024-04-22 NOTE — Patient Instructions (Addendum)
 Medication Instructions:  Continue your current medications.   *If you need a refill on your cardiac medications before your next appointment, please call your pharmacy*  Lab Work: Your recent cholesterol labs were much improved! Continue Rosuvastatin .  Follow-Up: In 6 months with [facility] (Dr. Raford, Reche GORMAN Finder, NP, or Kristin Alvstad, [facility])   We recommend signing up for the patient portal called MyChart.  Sign up information is provided on this After Visit Summary.  MyChart is used to connect with patients for Virtual Visits (Telemedicine).  Patients are able to view lab/test results, encounter notes, upcoming appointments, etc.  Non-urgent messages can be sent to your provider as well.   To learn more about what you can do with MyChart, go to [email or web address].   Other Instructions  Try adding either protein/healthy fat to your meals or adding a protein/healthy fat snack between breakfast/lunch and between lunch/dinner.   Some health snacks:    We have referred you to a free 2-week trial at [facility]. If you do not hear from them in a week or two, simply call [phone].  There is also the Right Start Program (flyer below) which you can sign up for!

## 2024-05-13 ENCOUNTER — Other Ambulatory Visit: Payer: Self-pay | Admitting: Physician Assistant

## 2024-08-02 ENCOUNTER — Telehealth: Payer: BC Managed Care – PPO | Admitting: Adult Health

## 2024-11-12 ENCOUNTER — Encounter: Admitting: Physician Assistant
